# Patient Record
Sex: Male | Born: 1935 | Race: White | Hispanic: No | Smoking: Former smoker
Health system: Southern US, Community
[De-identification: ages and names within clinical notes are randomized; demographics above are authoritative.]

## PROBLEM LIST (undated history)

## (undated) DIAGNOSIS — E785 Hyperlipidemia, unspecified: Secondary | ICD-10-CM

## (undated) HISTORY — DX: Hyperlipidemia, unspecified: E78.5

---

## 2015-02-13 DIAGNOSIS — Z0000 Encounter for general adult medical examination without abnormal findings: Secondary | ICD-10-CM | POA: Diagnosis not present

## 2015-02-13 DIAGNOSIS — Z23 Encounter for immunization: Secondary | ICD-10-CM | POA: Diagnosis not present

## 2015-02-13 DIAGNOSIS — E780 Pure hypercholesterolemia: Secondary | ICD-10-CM | POA: Diagnosis not present

## 2015-06-11 DIAGNOSIS — Z23 Encounter for immunization: Secondary | ICD-10-CM | POA: Diagnosis not present

## 2015-06-13 DIAGNOSIS — H2513 Age-related nuclear cataract, bilateral: Secondary | ICD-10-CM | POA: Diagnosis not present

## 2015-06-13 DIAGNOSIS — Z961 Presence of intraocular lens: Secondary | ICD-10-CM | POA: Diagnosis not present

## 2015-09-20 DIAGNOSIS — H6122 Impacted cerumen, left ear: Secondary | ICD-10-CM | POA: Diagnosis not present

## 2016-05-29 DIAGNOSIS — Z23 Encounter for immunization: Secondary | ICD-10-CM | POA: Diagnosis not present

## 2016-06-24 ENCOUNTER — Encounter: Payer: Self-pay | Admitting: Internal Medicine

## 2016-06-24 ENCOUNTER — Ambulatory Visit (INDEPENDENT_AMBULATORY_CARE_PROVIDER_SITE_OTHER): Payer: Medicare Other | Admitting: Internal Medicine

## 2016-06-24 VITALS — BP 118/78 | HR 65 | Temp 97.6°F | Ht 65.0 in | Wt 139.6 lb

## 2016-06-24 DIAGNOSIS — E782 Mixed hyperlipidemia: Secondary | ICD-10-CM | POA: Insufficient documentation

## 2016-06-24 DIAGNOSIS — M7730 Calcaneal spur, unspecified foot: Secondary | ICD-10-CM

## 2016-06-24 NOTE — Patient Instructions (Addendum)
Continue current medications as ordered  Follow up in 1-2 mos for CPE/MMSE/ECG. Fasting labs prior to visit

## 2016-06-24 NOTE — Progress Notes (Signed)
Patient ID: Francesca Vos, male   DOB: 1929/02/05, 82 y.o.   MRN: [id number]    Location:  P[facility] Place of Service: OFFICE    Advanced Directive information Does patient have an advance directive?: No, Would patient like information on creating an advanced directive?: No - patient declined information  Chief Complaint  Patient presents with  . Establish Care    New patient to establish care    HPI:  82 yo male seen today as a new patient. He has no c/o. He has occasional heel spur pain. No falls. He relocated from [place] about 5 mos ago when his wife expired. He now lives a few doors down from his son.   Hyperlipidemia - takes lipitor. No myalgias.  He takes ASA and MVI daily  He had flu shot at [employer] 1 month ago   History reviewed. No pertinent past medical history.  History reviewed. No pertinent surgical history.  Patient Care Team: Gildardo Cranker, DO as PCP - General (Internal Medicine)  Social History   Social History  . Marital status: Widowed    Spouse name: N/A  . Number of children: N/A  . Years of education: N/A   Occupational History  . Not on file.   Social History Main Topics  . Smoking status: Former Smoker    Types: Cigarettes    Quit date: 08/31/1958  . Smokeless tobacco: Never Used  . Alcohol use Yes     Comment: 1 drink every other day  . Drug use: No  . Sexual activity: Not on file   Other Topics Concern  . Not on file   Social History Narrative   Diet: NA   Caffeine:1 to 2 cups per day   Married:Widowed, married in [place], one stories, one person   Pets:no   Current/Past profession:Business Owner   Exercise:Yes, walking   Living Will: Yes   DNR:Yes   POA/HPOA:Yes        reports that he quit smoking about 57 years ago. His smoking use included Cigarettes. He has never used smokeless tobacco. He reports that he drinks alcohol. He reports that he does not use drugs.  Family History  Problem Relation Age of  Onset  . Heart attack Brother   . Heart disease Brother    Family Status  Relation Status  . Mother Deceased  . Father Deceased  . Brother Alive  . Brother Deceased  . Daughter Alive  . Son Alive    Immunization History  Administered Date(s) Administered  . Influenza-Unspecified 06/03/2016    No Known Allergies  Medications: Patient's Medications  New Prescriptions   No medications on file  Previous Medications   ASPIRIN 81 MG EC TABLET    Take 81 mg by mouth daily. Swallow whole.   ATORVASTATIN (LIPITOR) 40 MG TABLET    Take 20 mg by mouth daily. High Cholesterol   MULTIPLE VITAMIN (ONE-A-DAY MENS PO)    Take by mouth daily.  Modified Medications   No medications on file  Discontinued Medications   No medications on file    Review of Systems  Musculoskeletal: Positive for gait problem.  All other systems reviewed and are negative.   Vitals:   06/24/16 0842  BP: 118/78  Pulse: 65  Temp: 97.6 F (36.4 C)  TempSrc: Oral  SpO2: 93%  Weight: 139 lb 9.6 oz (63.3 kg)  Height: 5\' 5"  (1.651 m)   Body mass index is 23.23 kg/m.  Physical Exam  Constitutional: He is oriented to person, place, and time. He appears well-developed and well-nourished.  HENT:  Mouth/Throat: Oropharynx is clear and moist.  Eyes: Pupils are equal, round, and reactive to light. No scleral icterus.  Neck: Neck supple. Carotid bruit is not present. No thyromegaly present.  Cardiovascular: Normal rate, regular rhythm, normal heart sounds and intact distal pulses.  Exam reveals no gallop and no friction rub.   No murmur heard. no distal LE swelling. No calf TTP  Pulmonary/Chest: Effort normal and breath sounds normal. He has no wheezes. He has no rales. He exhibits no tenderness.  Abdominal: Soft. Bowel sounds are normal. He exhibits no distension, no abdominal bruit, no pulsatile midline mass and no mass. There is no hepatomegaly. There is no tenderness. There is no rebound and no guarding.    Lymphadenopathy:    He has no cervical adenopathy.  Neurological: He is alert and oriented to person, place, and time. He has normal reflexes.  Skin: Skin is warm and dry. No rash noted.  Psychiatric: He has a normal mood and affect. His behavior is normal. Judgment and thought content normal.     Labs reviewed: No results found for any previous visit.    No results found.   Assessment/Plan   ICD-9-CM ICD-10-CM   1. Mixed hyperlipidemia 272.2 E78.2   2. Heel spur, unspecified laterality 726.73 M77.30     He plans to travel to [facility] x 4 mos to stay with his daughter for the winter. He is looking into living at [facility] once he returns.  Continue current meds as ordered  Get old records from previous PCP Dr Ronny Flurry  Follow up for CPE/MMSE/ECG. Fasting labs prior to visit (cmp for high risk med use; lipid panel/tsh for hyperlipidemia, cbc w diff for well visit)  Erik Burkett S. Perlie Gold  [facility] [address] [place], [place] [address] [phone] Cell (Monday-Friday 8 AM - 5 PM) [phone] After 5 PM and follow prompts

## 2016-07-24 DIAGNOSIS — T1490XA Injury, unspecified, initial encounter: Secondary | ICD-10-CM | POA: Diagnosis not present

## 2016-07-24 DIAGNOSIS — Z87891 Personal history of nicotine dependence: Secondary | ICD-10-CM | POA: Diagnosis not present

## 2016-07-24 DIAGNOSIS — S79919A Unspecified injury of unspecified hip, initial encounter: Secondary | ICD-10-CM | POA: Diagnosis not present

## 2016-07-24 DIAGNOSIS — W010XXA Fall on same level from slipping, tripping and stumbling without subsequent striking against object, initial encounter: Secondary | ICD-10-CM | POA: Diagnosis not present

## 2016-07-24 DIAGNOSIS — S72001D Fracture of unspecified part of neck of right femur, subsequent encounter for closed fracture with routine healing: Secondary | ICD-10-CM | POA: Diagnosis not present

## 2016-07-24 DIAGNOSIS — S72051A Unspecified fracture of head of right femur, initial encounter for closed fracture: Secondary | ICD-10-CM | POA: Diagnosis not present

## 2016-07-24 DIAGNOSIS — Z741 Need for assistance with personal care: Secondary | ICD-10-CM | POA: Diagnosis not present

## 2016-07-24 DIAGNOSIS — S72009A Fracture of unspecified part of neck of unspecified femur, initial encounter for closed fracture: Secondary | ICD-10-CM | POA: Diagnosis not present

## 2016-07-24 DIAGNOSIS — W19XXXA Unspecified fall, initial encounter: Secondary | ICD-10-CM | POA: Diagnosis not present

## 2016-07-24 DIAGNOSIS — R488 Other symbolic dysfunctions: Secondary | ICD-10-CM | POA: Diagnosis not present

## 2016-07-24 DIAGNOSIS — R279 Unspecified lack of coordination: Secondary | ICD-10-CM | POA: Diagnosis not present

## 2016-07-24 DIAGNOSIS — S72001A Fracture of unspecified part of neck of right femur, initial encounter for closed fracture: Secondary | ICD-10-CM | POA: Diagnosis not present

## 2016-07-24 DIAGNOSIS — Z4789 Encounter for other orthopedic aftercare: Secondary | ICD-10-CM | POA: Diagnosis not present

## 2016-07-24 DIAGNOSIS — E785 Hyperlipidemia, unspecified: Secondary | ICD-10-CM | POA: Diagnosis not present

## 2016-07-26 HISTORY — PX: HIP SURGERY: SHX245

## 2016-07-28 DIAGNOSIS — Z4789 Encounter for other orthopedic aftercare: Secondary | ICD-10-CM | POA: Diagnosis not present

## 2016-07-28 DIAGNOSIS — Z741 Need for assistance with personal care: Secondary | ICD-10-CM | POA: Diagnosis not present

## 2016-07-28 DIAGNOSIS — R488 Other symbolic dysfunctions: Secondary | ICD-10-CM | POA: Diagnosis not present

## 2016-07-28 DIAGNOSIS — Z043 Encounter for examination and observation following other accident: Secondary | ICD-10-CM | POA: Diagnosis not present

## 2016-07-28 DIAGNOSIS — S72001D Fracture of unspecified part of neck of right femur, subsequent encounter for closed fracture with routine healing: Secondary | ICD-10-CM | POA: Diagnosis not present

## 2016-07-28 DIAGNOSIS — S72009A Fracture of unspecified part of neck of unspecified femur, initial encounter for closed fracture: Secondary | ICD-10-CM | POA: Diagnosis not present

## 2016-07-28 DIAGNOSIS — S72001A Fracture of unspecified part of neck of right femur, initial encounter for closed fracture: Secondary | ICD-10-CM | POA: Diagnosis not present

## 2016-07-28 DIAGNOSIS — E785 Hyperlipidemia, unspecified: Secondary | ICD-10-CM | POA: Diagnosis not present

## 2016-07-28 DIAGNOSIS — R279 Unspecified lack of coordination: Secondary | ICD-10-CM | POA: Diagnosis not present

## 2016-07-29 DIAGNOSIS — E785 Hyperlipidemia, unspecified: Secondary | ICD-10-CM | POA: Diagnosis not present

## 2016-07-29 DIAGNOSIS — S72001D Fracture of unspecified part of neck of right femur, subsequent encounter for closed fracture with routine healing: Secondary | ICD-10-CM | POA: Diagnosis not present

## 2016-07-29 DIAGNOSIS — Z4789 Encounter for other orthopedic aftercare: Secondary | ICD-10-CM | POA: Diagnosis not present

## 2016-07-31 DIAGNOSIS — Z4789 Encounter for other orthopedic aftercare: Secondary | ICD-10-CM | POA: Diagnosis not present

## 2016-07-31 DIAGNOSIS — E785 Hyperlipidemia, unspecified: Secondary | ICD-10-CM | POA: Diagnosis not present

## 2016-07-31 DIAGNOSIS — S72001D Fracture of unspecified part of neck of right femur, subsequent encounter for closed fracture with routine healing: Secondary | ICD-10-CM | POA: Diagnosis not present

## 2016-08-03 DIAGNOSIS — S72001D Fracture of unspecified part of neck of right femur, subsequent encounter for closed fracture with routine healing: Secondary | ICD-10-CM | POA: Diagnosis not present

## 2016-08-03 DIAGNOSIS — E785 Hyperlipidemia, unspecified: Secondary | ICD-10-CM | POA: Diagnosis not present

## 2016-08-03 DIAGNOSIS — Z4789 Encounter for other orthopedic aftercare: Secondary | ICD-10-CM | POA: Diagnosis not present

## 2016-08-04 DIAGNOSIS — S72001D Fracture of unspecified part of neck of right femur, subsequent encounter for closed fracture with routine healing: Secondary | ICD-10-CM | POA: Diagnosis not present

## 2016-08-04 DIAGNOSIS — E785 Hyperlipidemia, unspecified: Secondary | ICD-10-CM | POA: Diagnosis not present

## 2016-08-04 DIAGNOSIS — Z4789 Encounter for other orthopedic aftercare: Secondary | ICD-10-CM | POA: Diagnosis not present

## 2016-08-05 LAB — CBC AND DIFFERENTIAL
HCT: 34 % — AB (ref 41–53)
Hemoglobin: 11.4 g/dL — AB (ref 13.5–17.5)
Platelets: 418 10*3/uL — AB (ref 150–399)
WBC: 10.3 10*3/mL

## 2016-08-07 DIAGNOSIS — Z4789 Encounter for other orthopedic aftercare: Secondary | ICD-10-CM | POA: Diagnosis not present

## 2016-08-07 DIAGNOSIS — E785 Hyperlipidemia, unspecified: Secondary | ICD-10-CM | POA: Diagnosis not present

## 2016-08-07 DIAGNOSIS — S72001D Fracture of unspecified part of neck of right femur, subsequent encounter for closed fracture with routine healing: Secondary | ICD-10-CM | POA: Diagnosis not present

## 2016-08-10 ENCOUNTER — Telehealth: Payer: Self-pay

## 2016-08-10 DIAGNOSIS — M79604 Pain in right leg: Secondary | ICD-10-CM | POA: Diagnosis not present

## 2016-08-10 DIAGNOSIS — M79605 Pain in left leg: Secondary | ICD-10-CM | POA: Diagnosis not present

## 2016-08-10 DIAGNOSIS — M7989 Other specified soft tissue disorders: Secondary | ICD-10-CM | POA: Diagnosis not present

## 2016-08-10 NOTE — Telephone Encounter (Signed)
noted 

## 2016-08-10 NOTE — Telephone Encounter (Signed)
Patient called to inform Dr.Carter that he broke his right hip the day after thanksgiving and had his first surgery on 07/26/16. Patient in rehab in [place], patient is expecting to return in March 2018.

## 2016-08-11 DIAGNOSIS — S72001D Fracture of unspecified part of neck of right femur, subsequent encounter for closed fracture with routine healing: Secondary | ICD-10-CM | POA: Diagnosis not present

## 2016-08-11 DIAGNOSIS — Z4801 Encounter for change or removal of surgical wound dressing: Secondary | ICD-10-CM | POA: Diagnosis not present

## 2016-08-11 DIAGNOSIS — M25551 Pain in right hip: Secondary | ICD-10-CM | POA: Diagnosis not present

## 2016-08-11 DIAGNOSIS — W109XXD Fall (on) (from) unspecified stairs and steps, subsequent encounter: Secondary | ICD-10-CM | POA: Diagnosis not present

## 2016-08-11 DIAGNOSIS — R2689 Other abnormalities of gait and mobility: Secondary | ICD-10-CM | POA: Diagnosis not present

## 2016-08-12 DIAGNOSIS — R2689 Other abnormalities of gait and mobility: Secondary | ICD-10-CM | POA: Diagnosis not present

## 2016-08-12 DIAGNOSIS — Z4801 Encounter for change or removal of surgical wound dressing: Secondary | ICD-10-CM | POA: Diagnosis not present

## 2016-08-12 DIAGNOSIS — W109XXD Fall (on) (from) unspecified stairs and steps, subsequent encounter: Secondary | ICD-10-CM | POA: Diagnosis not present

## 2016-08-12 DIAGNOSIS — M25551 Pain in right hip: Secondary | ICD-10-CM | POA: Diagnosis not present

## 2016-08-12 DIAGNOSIS — S72001D Fracture of unspecified part of neck of right femur, subsequent encounter for closed fracture with routine healing: Secondary | ICD-10-CM | POA: Diagnosis not present

## 2016-08-14 DIAGNOSIS — R2689 Other abnormalities of gait and mobility: Secondary | ICD-10-CM | POA: Diagnosis not present

## 2016-08-14 DIAGNOSIS — S72001D Fracture of unspecified part of neck of right femur, subsequent encounter for closed fracture with routine healing: Secondary | ICD-10-CM | POA: Diagnosis not present

## 2016-08-14 DIAGNOSIS — M25551 Pain in right hip: Secondary | ICD-10-CM | POA: Diagnosis not present

## 2016-08-14 DIAGNOSIS — Z4801 Encounter for change or removal of surgical wound dressing: Secondary | ICD-10-CM | POA: Diagnosis not present

## 2016-08-14 DIAGNOSIS — W109XXD Fall (on) (from) unspecified stairs and steps, subsequent encounter: Secondary | ICD-10-CM | POA: Diagnosis not present

## 2016-08-18 DIAGNOSIS — M25551 Pain in right hip: Secondary | ICD-10-CM | POA: Diagnosis not present

## 2016-08-18 DIAGNOSIS — S72001D Fracture of unspecified part of neck of right femur, subsequent encounter for closed fracture with routine healing: Secondary | ICD-10-CM | POA: Diagnosis not present

## 2016-08-18 DIAGNOSIS — R2689 Other abnormalities of gait and mobility: Secondary | ICD-10-CM | POA: Diagnosis not present

## 2016-08-18 DIAGNOSIS — Z4801 Encounter for change or removal of surgical wound dressing: Secondary | ICD-10-CM | POA: Diagnosis not present

## 2016-08-18 DIAGNOSIS — W109XXD Fall (on) (from) unspecified stairs and steps, subsequent encounter: Secondary | ICD-10-CM | POA: Diagnosis not present

## 2016-08-21 DIAGNOSIS — R2689 Other abnormalities of gait and mobility: Secondary | ICD-10-CM | POA: Diagnosis not present

## 2016-08-21 DIAGNOSIS — M25551 Pain in right hip: Secondary | ICD-10-CM | POA: Diagnosis not present

## 2016-08-21 DIAGNOSIS — W109XXD Fall (on) (from) unspecified stairs and steps, subsequent encounter: Secondary | ICD-10-CM | POA: Diagnosis not present

## 2016-08-21 DIAGNOSIS — Z4801 Encounter for change or removal of surgical wound dressing: Secondary | ICD-10-CM | POA: Diagnosis not present

## 2016-08-21 DIAGNOSIS — S72001D Fracture of unspecified part of neck of right femur, subsequent encounter for closed fracture with routine healing: Secondary | ICD-10-CM | POA: Diagnosis not present

## 2016-08-27 DIAGNOSIS — S72001D Fracture of unspecified part of neck of right femur, subsequent encounter for closed fracture with routine healing: Secondary | ICD-10-CM | POA: Diagnosis not present

## 2016-08-27 DIAGNOSIS — N400 Enlarged prostate without lower urinary tract symptoms: Secondary | ICD-10-CM | POA: Diagnosis not present

## 2016-08-28 DIAGNOSIS — S72001D Fracture of unspecified part of neck of right femur, subsequent encounter for closed fracture with routine healing: Secondary | ICD-10-CM | POA: Diagnosis not present

## 2016-09-01 DIAGNOSIS — M1651 Unilateral post-traumatic osteoarthritis, right hip: Secondary | ICD-10-CM | POA: Diagnosis not present

## 2016-09-03 DIAGNOSIS — S72001A Fracture of unspecified part of neck of right femur, initial encounter for closed fracture: Secondary | ICD-10-CM | POA: Diagnosis not present

## 2016-09-03 DIAGNOSIS — Z96641 Presence of right artificial hip joint: Secondary | ICD-10-CM | POA: Diagnosis not present

## 2016-09-07 DIAGNOSIS — M1651 Unilateral post-traumatic osteoarthritis, right hip: Secondary | ICD-10-CM | POA: Diagnosis not present

## 2016-09-11 ENCOUNTER — Telehealth: Payer: Self-pay

## 2016-09-11 NOTE — Telephone Encounter (Signed)
Patient will return October 03, 2016 from [place]. Patient has 4 more physical therapy appointments to complete and feels he is doing great.   Patient would like home health evaluation when he returns. Patient needs his home evaluated for safety precautions following his right hip surgery and possible more physical therapy.   Patient was informed he may need to schedule a follow-up with Dr.Carter first and then a referral can be placed for we do not have any documentation of right hip surgery.   Please advise

## 2016-09-11 NOTE — Telephone Encounter (Signed)
Spoke with Ulice Dash, patients son he stated that he would need to speak to his father and physical therapist before he schedule an appointment. He said that he would give our office a call to schedule an appointment.

## 2016-09-11 NOTE — Telephone Encounter (Signed)
He needs appt to assess his needs 1st

## 2016-09-11 NOTE — Telephone Encounter (Signed)
Called patient at both numbers listed the [phone] home number I am unable to leave a message and the [phone] cell number is no longer working.

## 2016-09-18 ENCOUNTER — Telehealth: Payer: Self-pay | Admitting: *Deleted

## 2016-09-18 DIAGNOSIS — M1651 Unilateral post-traumatic osteoarthritis, right hip: Secondary | ICD-10-CM | POA: Diagnosis not present

## 2016-09-18 DIAGNOSIS — S72001A Fracture of unspecified part of neck of right femur, initial encounter for closed fracture: Secondary | ICD-10-CM

## 2016-09-18 NOTE — Telephone Encounter (Signed)
Order placed and son notified.

## 2016-09-18 NOTE — Telephone Encounter (Signed)
Patient son notified. His father has not came back yet.

## 2016-09-18 NOTE — Telephone Encounter (Signed)
Appointment scheduled.

## 2016-09-18 NOTE — Telephone Encounter (Signed)
Patient son, Ulice Dash called and stated that patient was in [place] visiting his daughter and fell and busted hip. Had hip surgery and receiving PT down there. Surgeon cleared him to come home. Son is wanting to know if you know who would place grab bars in his home. He has an appointment to follow up with you on 10/07/16. Please Advise.

## 2016-09-18 NOTE — Addendum Note (Signed)
Addended by: Rafael Bihari A on: 09/18/2016 04:41 PM   Modules accepted: Orders

## 2016-09-18 NOTE — Telephone Encounter (Signed)
Is it ok to refer patient to [facility] for here for them to go out and evaluate and treat. Please Advise.

## 2016-09-18 NOTE — Telephone Encounter (Signed)
Yes. I thought we already referred him to [facility]? He will need to continue PT/OT once he returns to the area

## 2016-09-18 NOTE — Telephone Encounter (Signed)
Home health should be able to place grab bars. Please contact them

## 2016-09-21 DIAGNOSIS — M1651 Unilateral post-traumatic osteoarthritis, right hip: Secondary | ICD-10-CM | POA: Diagnosis not present

## 2016-09-24 DIAGNOSIS — M7989 Other specified soft tissue disorders: Secondary | ICD-10-CM | POA: Diagnosis not present

## 2016-09-24 DIAGNOSIS — M1651 Unilateral post-traumatic osteoarthritis, right hip: Secondary | ICD-10-CM | POA: Diagnosis not present

## 2016-09-24 DIAGNOSIS — Z96641 Presence of right artificial hip joint: Secondary | ICD-10-CM | POA: Diagnosis not present

## 2016-09-24 DIAGNOSIS — S72001A Fracture of unspecified part of neck of right femur, initial encounter for closed fracture: Secondary | ICD-10-CM | POA: Diagnosis not present

## 2016-09-29 DIAGNOSIS — M1651 Unilateral post-traumatic osteoarthritis, right hip: Secondary | ICD-10-CM | POA: Diagnosis not present

## 2016-10-06 ENCOUNTER — Other Ambulatory Visit: Payer: Self-pay

## 2016-10-06 DIAGNOSIS — R261 Paralytic gait: Secondary | ICD-10-CM | POA: Diagnosis not present

## 2016-10-06 DIAGNOSIS — Z7982 Long term (current) use of aspirin: Secondary | ICD-10-CM | POA: Diagnosis not present

## 2016-10-06 DIAGNOSIS — Z9181 History of falling: Secondary | ICD-10-CM | POA: Diagnosis not present

## 2016-10-06 DIAGNOSIS — E782 Mixed hyperlipidemia: Secondary | ICD-10-CM

## 2016-10-06 DIAGNOSIS — Z0000 Encounter for general adult medical examination without abnormal findings: Secondary | ICD-10-CM

## 2016-10-06 DIAGNOSIS — M7730 Calcaneal spur, unspecified foot: Secondary | ICD-10-CM | POA: Diagnosis not present

## 2016-10-06 DIAGNOSIS — Z79899 Other long term (current) drug therapy: Secondary | ICD-10-CM

## 2016-10-06 DIAGNOSIS — S72001D Fracture of unspecified part of neck of right femur, subsequent encounter for closed fracture with routine healing: Secondary | ICD-10-CM | POA: Diagnosis not present

## 2016-10-07 ENCOUNTER — Telehealth: Payer: Self-pay

## 2016-10-07 ENCOUNTER — Telehealth: Payer: Self-pay | Admitting: Internal Medicine

## 2016-10-07 ENCOUNTER — Ambulatory Visit (INDEPENDENT_AMBULATORY_CARE_PROVIDER_SITE_OTHER): Payer: Medicare Other | Admitting: Internal Medicine

## 2016-10-07 ENCOUNTER — Encounter: Payer: Self-pay | Admitting: Internal Medicine

## 2016-10-07 VITALS — BP 124/68 | HR 76 | Temp 97.7°F | Ht 65.0 in | Wt 136.6 lb

## 2016-10-07 DIAGNOSIS — S72001A Fracture of unspecified part of neck of right femur, initial encounter for closed fracture: Secondary | ICD-10-CM

## 2016-10-07 DIAGNOSIS — Z967 Presence of other bone and tendon implants: Secondary | ICD-10-CM | POA: Diagnosis not present

## 2016-10-07 DIAGNOSIS — Z8781 Personal history of (healed) traumatic fracture: Secondary | ICD-10-CM | POA: Diagnosis not present

## 2016-10-07 DIAGNOSIS — E782 Mixed hyperlipidemia: Secondary | ICD-10-CM | POA: Diagnosis not present

## 2016-10-07 DIAGNOSIS — Z7982 Long term (current) use of aspirin: Secondary | ICD-10-CM | POA: Diagnosis not present

## 2016-10-07 DIAGNOSIS — Z9181 History of falling: Secondary | ICD-10-CM | POA: Diagnosis not present

## 2016-10-07 DIAGNOSIS — M7730 Calcaneal spur, unspecified foot: Secondary | ICD-10-CM | POA: Diagnosis not present

## 2016-10-07 DIAGNOSIS — Z9889 Other specified postprocedural states: Secondary | ICD-10-CM

## 2016-10-07 DIAGNOSIS — S72001D Fracture of unspecified part of neck of right femur, subsequent encounter for closed fracture with routine healing: Secondary | ICD-10-CM | POA: Diagnosis not present

## 2016-10-07 DIAGNOSIS — R261 Paralytic gait: Secondary | ICD-10-CM | POA: Diagnosis not present

## 2016-10-07 NOTE — Patient Instructions (Addendum)
Continue current medications as ordered  Will call with referral for Ortho  Follow up as scheduled

## 2016-10-07 NOTE — Telephone Encounter (Signed)
left message asking pt to confirm this AWV appt w/ nurse. VDM (DD) °

## 2016-10-07 NOTE — Progress Notes (Signed)
Patient ID: Dalton Golden, male   DOB: 1928-10-31, 83 y.o.   MRN: [id number]    Location:  P[facility] Place of Service: OFFICE  Chief Complaint  Patient presents with  . Follow-up    Follow up from surgery    HPI:  83 yo male seen today for hospital f/u. He fell and broke his right hip Jul 24, 2016 while in [place]. He underwent right hip ORIF 07/26/16 and spent several weeks in rehab in [place], [place]. Records not available for review at this time  Hyperlipidemia - takes lipitor. No myalgias.  He takes ASA and MVI daily   Past Medical History:  Diagnosis Date  . Hyperlipidemia     Past Surgical History:  Procedure Laterality Date  . HIP SURGERY Right 07/26/2016   [facility]    Patient Care Team: Gildardo Cranker, DO as PCP - General (Internal Medicine)  Social History   Social History  . Marital status: Widowed    Spouse name: N/A  . Number of children: N/A  . Years of education: N/A   Occupational History  . Not on file.   Social History Main Topics  . Smoking status: Former Smoker    Types: Cigarettes    Quit date: 08/31/1958  . Smokeless tobacco: Never Used  . Alcohol use Yes     Comment: 1 drink every other day  . Drug use: No  . Sexual activity: Not on file   Other Topics Concern  . Not on file   Social History Narrative   Diet: NA   Caffeine:1 to 2 cups per day   Married:Widowed, married in [place], one stories, one person   Pets:no   Current/Past profession:Business Owner   Exercise:Yes, walking   Living Will: Yes   DNR:Yes   POA/HPOA:Yes        reports that he quit smoking about 58 years ago. His smoking use included Cigarettes. He has never used smokeless tobacco. He reports that he drinks alcohol. He reports that he does not use drugs.  Family History  Problem Relation Age of Onset  . Heart attack Brother   . Heart disease Brother    Family Status  Relation Status  . Mother Deceased  . Father Deceased  . Brother  Alive  . Brother Deceased  . Daughter Alive  . Son Alive     No Known Allergies  Medications: Patient's Medications  New Prescriptions   No medications on file  Previous Medications   ASPIRIN 81 MG EC TABLET    Take 81 mg by mouth daily. Swallow whole.   ATORVASTATIN (LIPITOR) 40 MG TABLET    Take 20 mg by mouth daily. High Cholesterol   DOCUSATE CALCIUM (STOOL SOFTENER PO)    Take 1 tablet by mouth daily.   MULTIPLE VITAMIN (ONE-A-DAY MENS PO)    Take by mouth daily.  Modified Medications   No medications on file  Discontinued Medications   No medications on file    Review of Systems  Musculoskeletal: Positive for gait problem.  All other systems reviewed and are negative.   Vitals:   10/07/16 0905  BP: 124/68  Pulse: 76  Temp: 97.7 F (36.5 C)  TempSrc: Oral  SpO2: 95%  Weight: 136 lb 9.6 oz (62 kg)  Height: 5\' 5"  (1.651 m)   Body mass index is 22.73 kg/m.  Physical Exam  Constitutional: He is oriented to person, place, and time. He appears well-developed and well-nourished.  HENT:  Mouth/Throat: Oropharynx is clear and moist.  Eyes: Pupils are equal, round, and reactive to light. No scleral icterus.  Neck: Neck supple. Carotid bruit is not present. No thyromegaly present.  Cardiovascular: Normal rate, regular rhythm, normal heart sounds and intact distal pulses.  Exam reveals no gallop and no friction rub.   No murmur heard. no distal LE swelling. No calf TTP  Pulmonary/Chest: Effort normal and breath sounds normal. He has no wheezes. He has no rales. He exhibits no tenderness.  Abdominal: Soft. Bowel sounds are normal. He exhibits no distension, no abdominal bruit, no pulsatile midline mass and no mass. There is no hepatomegaly. There is no tenderness. There is no rebound and no guarding.  Musculoskeletal: He exhibits edema.  Right proximal hip incisional scar, NT and no wound dehiscence.  Lymphadenopathy:    He has no cervical adenopathy.  Neurological:  He is alert and oriented to person, place, and time. Gait abnormal.  Skin: Skin is warm and dry. No rash noted.  Psychiatric: He has a normal mood and affect. His behavior is normal. Judgment and thought content normal.     Labs reviewed: No visits with results within 3 Month(s) from this visit.  Latest known visit with results is:  No results found for any previous visit.    No results found.   Assessment/Plan   ICD-9-CM ICD-10-CM   1. S/P ORIF (open reduction internal fixation) fracture V45.89 Z96.7 Ambulatory referral to Orthopedic Surgery   V15.51 Z87.81    right hip  2. Closed fracture of right hip, initial encounter ([place]) 820.8 S72.001A Ambulatory referral to Orthopedic Surgery  3. Mixed hyperlipidemia 272.2 E78.2     Continue HH PT/OT for gait training and balance. He also may benefit from grab bars in home  Continue current medications as ordered  Will call with referral for Ortho  Follow up as scheduled   Joelie Schou S. Perlie Gold  [facility] [address] [place], [place] [address] [phone] Cell (Monday-Friday 8 AM - 5 PM) [phone] After 5 PM and follow prompts

## 2016-10-07 NOTE — Telephone Encounter (Signed)
Left message for medical records to give [place] a call back to request his medical records.

## 2016-10-07 NOTE — Telephone Encounter (Signed)
Order placed

## 2016-10-07 NOTE — Telephone Encounter (Signed)
Patient was evaluated by Laurey Arrow (physical therapist) with [facility]. Patient is doing great and Laurey Arrow is recommending out-patient physical therapy (patient and son in agreement).   Please advise

## 2016-10-07 NOTE — Telephone Encounter (Signed)
[facility] for o/p PT. Please send referral. He is s/p right hip ORIF

## 2016-10-13 ENCOUNTER — Encounter (INDEPENDENT_AMBULATORY_CARE_PROVIDER_SITE_OTHER): Payer: Self-pay | Admitting: Orthopaedic Surgery

## 2016-10-13 ENCOUNTER — Ambulatory Visit (INDEPENDENT_AMBULATORY_CARE_PROVIDER_SITE_OTHER): Payer: Medicare Other | Admitting: Orthopaedic Surgery

## 2016-10-13 ENCOUNTER — Ambulatory Visit (INDEPENDENT_AMBULATORY_CARE_PROVIDER_SITE_OTHER): Payer: Medicare Other

## 2016-10-13 DIAGNOSIS — S72001A Fracture of unspecified part of neck of right femur, initial encounter for closed fracture: Secondary | ICD-10-CM

## 2016-10-13 NOTE — Progress Notes (Signed)
Office Visit Note   Patient: Denahi Ladzinski           Date of Birth: 1929-08-09           MRN: [id number] Visit Date: 10/13/2016              Requested by: Gildardo Cranker, DO [address] [place], [place] [id number] PCP: Gildardo Cranker, DO   Assessment & Plan: Visit Diagnoses:  1. Closed displaced fracture of right femoral neck ([place])     Plan: patient is doing very well.  Continue with HEP.  Cane as needed.  F/u prn.  Follow-Up Instructions: Return if symptoms worsen or fail to improve.   Orders:  Orders Placed This Encounter  Procedures  . XR HIP UNILAT W OR W/O PELVIS 2-3 VIEWS RIGHT   No orders of the defined types were placed in this encounter.     Procedures: No procedures performed   Clinical Data: No additional findings.   Subjective: Chief Complaint  Patient presents with  . Right Hip - Pain    83 yo male here for right hip fracture treated with hemi back in November in [place].  Doing well.  Has moved back up to [place].  PCP would like him to come see [place] for f/u.  He denies any pain or issues.  Walking with cane at good pace.      Review of Systems   Objective: Vital Signs: There were no vitals taken for this visit.  Physical Exam  Ortho Exam Right hip exam - well healed scar - ambulates at normal pace, no limp - good ROM  Specialty Comments:  No specialty comments available.  Imaging: Xr Hip Unilat W Or W/o Pelvis 2-3 Views Right  Result Date: 10/13/2016 Stable hemiarthroplasty    PMFS History: Patient Active Problem List   Diagnosis Date Noted  . Closed displaced fracture of right femoral neck ([place]) 10/13/2016  . Mixed hyperlipidemia 06/24/2016  . Heel spur, unspecified laterality 06/24/2016   Past Medical History:  Diagnosis Date  . Hyperlipidemia     Family History  Problem Relation Age of Onset  . Heart attack Brother   . Heart disease Brother     Past Surgical History:  Procedure Laterality Date  . HIP  SURGERY Right 07/26/2016   [facility]   Social History   Occupational History  . Not on file.   Social History Main Topics  . Smoking status: Former Smoker    Types: Cigarettes    Quit date: 08/31/1958  . Smokeless tobacco: Never Used  . Alcohol use Yes     Comment: 1 drink every other day  . Drug use: No  . Sexual activity: Not on file          Office Visit Note   Patient: Tristion Vint           Date of Birth: 11-15-1928           MRN: [id number] Visit Date:               Requested by: Gildardo Cranker, DO [place], [place] [address] PCP: Gildardo Cranker, DO   Assessment & Plan: Visit Diagnoses:  1. Closed displaced fracture of right femoral neck ([place])     Plan: see other plan  Follow-Up Instructions: Return if symptoms worsen or fail to improve.   Orders:  Orders Placed This Encounter  Procedures  . XR HIP UNILAT W OR W/O PELVIS 2-3  VIEWS RIGHT   No orders of the defined types were placed in this encounter.     Procedures: No procedures performed   Clinical Data: No additional findings.   Subjective: Chief Complaint  Patient presents with  . Right Hip - Pain    HPI  Review of Systems   Objective: Vital Signs: There were no vitals taken for this visit.  Physical Exam  Ortho Exam  Specialty Comments:  No specialty comments available.  Imaging: Xr Hip Unilat W Or W/o Pelvis 2-3 Views Right  Result Date: 10/13/2016 Stable hemiarthroplasty    PMFS History: Patient Active Problem List   Diagnosis Date Noted  . Closed displaced fracture of right femoral neck ([place]) 10/13/2016  . Mixed hyperlipidemia 06/24/2016  . Heel spur, unspecified laterality 06/24/2016   Past Medical History:  Diagnosis Date  . Hyperlipidemia     Family History  Problem Relation Age of Onset  . Heart attack Brother   . Heart disease Brother     Past Surgical History:  Procedure Laterality Date  . HIP SURGERY Right  07/26/2016   [facility]   Social History   Occupational History  . Not on file.   Social History Main Topics  . Smoking status: Former Smoker    Types: Cigarettes    Quit date: 08/31/1958  . Smokeless tobacco: Never Used  . Alcohol use Yes     Comment: 1 drink every other day  . Drug use: No  . Sexual activity: Not on file

## 2016-10-20 ENCOUNTER — Encounter: Payer: Self-pay | Admitting: Internal Medicine

## 2016-10-22 ENCOUNTER — Ambulatory Visit: Payer: Medicare Other | Attending: Internal Medicine | Admitting: Physical Therapy

## 2016-10-22 DIAGNOSIS — M25651 Stiffness of right hip, not elsewhere classified: Secondary | ICD-10-CM | POA: Diagnosis not present

## 2016-10-22 DIAGNOSIS — R262 Difficulty in walking, not elsewhere classified: Secondary | ICD-10-CM | POA: Insufficient documentation

## 2016-10-22 DIAGNOSIS — M6281 Muscle weakness (generalized): Secondary | ICD-10-CM

## 2016-10-23 ENCOUNTER — Encounter: Payer: Self-pay | Admitting: Physical Therapy

## 2016-10-23 NOTE — Therapy (Signed)
[place], [place], [address] Phone: [phone]   Fax:  [phone]  Physical Therapy Evaluation  Patient Details  Name: Authur Laughead MRN: [id number] Date of Birth: [birth year] Referring Provider: Dr Frankey Shown   Encounter Date: 10/22/2016      PT End of Session - 10/22/16 1537    Visit Number 1   Number of Visits 16   Date for PT Re-Evaluation 12/17/16   Authorization Type Medicare   Activity Tolerance Patient tolerated treatment well   Behavior During Therapy [facility] for tasks assessed/performed      Past Medical History:  Diagnosis Date  . Hyperlipidemia     Past Surgical History:  Procedure Laterality Date  . HIP SURGERY Right 07/26/2016   [facility]    There were no vitals filed for this visit.       Subjective Assessment - 10/22/16 1533    Subjective Patient had a fall in [place] and had a hemiarthroplasty on the right. He has made good progress since that point. He currently feels like he is having difficulty stepping into the bathtub and putting his shoes on.    Limitations Standing;House hold activities   How long can you sit comfortably? No limit   How long can you stand comfortably? No limit    How long can you walk comfortably? decreased community ambualtion from baseline    Diagnostic tests Nothing recently    Patient Stated Goals To put his shoes on    Currently in Pain? No/denies            [place] PT Assessment - 10/23/16 0001      Assessment   Medical Diagnosis Right hip    Referring Provider Dr Frankey Shown    Onset Date/Surgical Date 07/25/16   Hand Dominance Right   Next MD Visit None Scheduled      Precautions   Precautions None     Restrictions   Weight Bearing Restrictions No   Other Position/Activity Restrictions n     Home Environment   Additional Comments 1 step into the living area and 1 step into the bathroom      Prior Function    Level of Independence Independent   Vocation Retired   Leisure walking,      Associate Professor   Overall Cognitive Status Within Functional Limits for tasks assessed   Attention Focused   Focused Attention Appears intact   Memory Appears intact   Awareness Appears intact   Problem Solving Appears intact     Sensation   Additional Comments No numbness      Coordination   Gross Motor Movements are Fluid and Coordinated Yes   Fine Motor Movements are Fluid and Coordinated Yes     AROM   AROM Assessment Site Hip;Knee   Right/Left Hip Right   Right Hip Flexion 90  No pain but resitance felt. No hard endfeel.    Right Hip External Rotation  23   Right Hip Internal Rotation  10   Right/Left Knee Right     Strength   Strength Assessment Site Hip   Right/Left Hip Right;Left   Right Hip Flexion 4+/5   Right Hip ABduction 4+/5   Left Hip Flexion 5/5   Left Hip ABduction 5/5     Palpation   Palpation comment No tenderness to palpation                   [facility] Adult PT  Treatment/Exercise - 10/23/16 0001      Lumbar Exercises: Stretches   Single Knee to Chest Stretch Limitations 3x20sec hold    Lower Trunk Rotation Limitations x10     Knee/Hip Exercises: Supine   Straight Leg Raises Limitations 2x10     Manual Therapy   Manual therapy comments Gnetle manual flexion stretching being carefull not to push him too far back into flexion. Patient reports his scar is anteirolateral but therapy will still be cautious 2nd to his age. Gentler ER stretching to improve patients ability to put his shoes on.                 PT Education - 10/23/16 1230    Education provided Yes   Education Details advised with hip flexion stretching not to go too far past 90 degrees    Person(s) Educated Patient   Methods Explanation;Demonstration;Tactile cues   Comprehension Verbalized understanding;Returned demonstration          PT Short Term Goals - 10/23/16 1237      PT SHORT  TERM GOAL #1   Title Patient will increase passive hip flexion on the right to 100 degrees    Time 4   Period Weeks   Status New     PT SHORT TERM GOAL #2   Title Patient will increase hip external rotation by 10 degrees    Time 4   Period Weeks   Status New     PT SHORT TERM GOAL #3   Title Patient will demsotrate 5/5 gross right hip strength    Time 4   Status New     PT SHORT TERM GOAL #4   Title Patient will be independent with initial HEP    Time 4   Period Weeks   Status New           PT Long Term Goals - 10/23/16 1238      PT LONG TERM GOAL #1   Title Patient will put his right shoe on without difficulty    Time 8   Period Weeks   Status New     PT LONG TERM GOAL #2   Title Patient will ambualte 1 mile without increased fatigue    Time 8   Period Weeks   Status New     PT LONG TERM GOAL #3   Title Patient will go up a step in his house without difficulty    Time 8   Period Weeks   Status New               Plan - 10/22/16 1538    Clinical Impression Statement Patient is a 83 year old male S/P rhip Hemiarthroplasty on 07/26/2017. He presents with decreased hip internal and external rotation as well as decreased hip flexion. His ambualtion distance is decreased compared to baseline. He is very active. He also feels like he has some difficulty with his step up into his house from his mudroom. He was given light stretching but advised to not push his stretches. With light stretching and strengthening he should be able to reach his goals of stepping into the tub and putting his shoes on. He was seen for a low comlpexity evalautions.    Rehab Potential Good   PT Frequency 2x / week   PT Duration 8 weeks   PT Treatment/Interventions ADLs/Self Care Home Management;Cryotherapy;Electrical Stimulation;Iontophoresis 4mg /ml Dexamethasone;Stair training;Gait training;Ultrasound;Moist Heat;Therapeutic activities;Therapeutic exercise;Patient/family  education;Passive range of motion;Manual techniques;Taping;Splinting   PT Next Visit  Plan continue with light manual hip stretching and reviewe HEP stretches, Consider quad set, SAQ, supine hip flexion, any type of hip abduction strengthening, 4 inch step up, standing hip flexion.    PT Home Exercise Plan lateral trunk rotation to improve hip ER/.IR, single knee to chest to improve hip flexion, Straight leg raise.    Consulted and Agree with Plan of Care Patient      Patient will benefit from skilled therapeutic intervention in order to improve the following deficits and impairments:  Abnormal gait, Decreased activity tolerance, Decreased strength, Decreased endurance, Decreased mobility, Difficulty walking, Decreased range of motion  Visit Diagnosis: Stiffness of right hip, not elsewhere classified - Plan: PT plan of care cert/re-cert  Difficulty in walking, not elsewhere classified - Plan: PT plan of care cert/re-cert  Muscle weakness (generalized) - Plan: PT plan of care cert/re-cert      G-Codes - [id number]    Functional Assessment Tool Used (Outpatient Only) FOTO, clinical decision making    Functional Limitation Mobility: Walking and moving around   Mobility: Walking and Moving Around Current Status [id number]) At least 40 percent but less than 60 percent impaired, limited or restricted   Mobility: Walking and Moving Around Goal Status [phone]) At least 20 percent but less than 40 percent impaired, limited or restricted       Problem List Patient Active Problem List   Diagnosis Date Noted  . Closed displaced fracture of right femoral neck ([place]) 10/13/2016  . Mixed hyperlipidemia 06/24/2016  . Heel spur, unspecified laterality 06/24/2016    Carney Living PT DPT  10/23/2016, 1:06 PM  [facility] [address] [place], [place], [address] Phone: [phone]   Fax:  [phone]  Name: Kaulin Parrotte MRN:  [id number] Date of Birth: 06-Jul-1929

## 2016-10-27 ENCOUNTER — Ambulatory Visit: Payer: Medicare Other | Admitting: Physical Therapy

## 2016-11-03 ENCOUNTER — Telehealth (INDEPENDENT_AMBULATORY_CARE_PROVIDER_SITE_OTHER): Payer: Self-pay | Admitting: Orthopaedic Surgery

## 2016-11-03 ENCOUNTER — Ambulatory Visit: Payer: Medicare Other | Attending: Internal Medicine | Admitting: Physical Therapy

## 2016-11-03 DIAGNOSIS — M25651 Stiffness of right hip, not elsewhere classified: Secondary | ICD-10-CM

## 2016-11-03 DIAGNOSIS — R262 Difficulty in walking, not elsewhere classified: Secondary | ICD-10-CM

## 2016-11-03 DIAGNOSIS — M6281 Muscle weakness (generalized): Secondary | ICD-10-CM

## 2016-11-03 NOTE — Therapy (Signed)
[place], [place], [address] Phone: [phone]   Fax:  [phone]  Physical Therapy Treatment  Patient Details  Name: Dalton Golden MRN: [id number] Date of Birth: [birth year] Referring Provider: Dr Frankey Shown   Encounter Date: 11/03/2016      PT End of Session - 11/03/16 1702    Visit Number 2   Number of Visits 16   Date for PT Re-Evaluation 12/17/16   PT Start Time [id number]   PT Stop Time 1502   PT Time Calculation (min) 28 min   Activity Tolerance Patient tolerated treatment well   Behavior During Therapy [facility] for tasks assessed/performed      Past Medical History:  Diagnosis Date  . Hyperlipidemia     Past Surgical History:  Procedure Laterality Date  . HIP SURGERY Right 07/26/2016   [facility]    There were no vitals filed for this visit.      Subjective Assessment - 11/03/16 1446    Subjective Patient was late,  he could not find a place to eat.  I woiuld like to be able to tie my shoes without forcing my hip.   Currently in Pain? No/denies                         [name] - 11/03/16 0001      Ambulation/Gait   Pre-Gait Activities weight shifts at wall,  increased gluteal activation noted with these     Knee/Hip Exercises: Standing   Knee Flexion 1 set;10 reps   Hip Flexion 1 set;10 reps   Hip Flexion Limitations cues to keep on task   Forward Step Up Right;1 set;10 reps;Hand Hold: 2;Step Height: 4"   Forward Step Up Limitations patient steps up with ease using arms to pull vs a lot of hip musculature,  cues did not help his technique     Knee/Hip Exercises: Seated   Other Seated Knee/Hip Exercises Hip ER 10 x with 1 second hold,  cued for technique and posture.     Knee/Hip Exercises: Supine   Bridges Limitations 10 X   Other Supine Knee/Hip Exercises clams with band 10 X     Manual Therapy   Manual Therapy Passive  ROM   Passive ROM Right hip abduction, flexion ER ,  heavy cues to get patient to relax and decrease guarding,  Very gentle stretches                  PT Short Term Goals - 11/03/16 1706      PT SHORT TERM GOAL #1   Title Patient will increase passive hip flexion on the right to 100 degrees    Time 4   Period Weeks   Status On-going     PT SHORT TERM GOAL #2   Title Patient will increase hip external rotation by 10 degrees    Time 4   Period Weeks   Status On-going     PT SHORT TERM GOAL #3   Title Patient will demsotrate 5/5 gross right hip strength    Time 4   Period Weeks   Status Unable to assess     PT SHORT TERM GOAL #4   Title Patient will be independent with initial HEP    Baseline cues   Time 4   Period Weeks   Status On-going           PT Long  Term Goals - 10/23/16 1238      PT LONG TERM GOAL #1   Title Patient will put his right shoe on without difficulty    Time 8   Period Weeks   Status New     PT LONG TERM GOAL #2   Title Patient will ambualte 1 mile without increased fatigue    Time 8   Period Weeks   Status New     PT LONG TERM GOAL #3   Title Patient will go up a step in his house without difficulty    Time 8   Period Weeks   Status New               Plan - 11/03/16 1703    Clinical Impression Statement Short session due to patient was late.  He needs cues to keep on task.  No new exercises added.  No pain during session reported or observed. Focue today on ROM,  gait and strengthening.  No new objective findings noted.     PT Next Visit Plan continue with light manual hip stretching and reviewe HEP stretches, Consider quad set, SAQ, supine hip flexion, any type of hip abduction strengthening, 6 inch step up, standing hip flexion.    PT Home Exercise Plan lateral trunk rotation to improve hip ER/.IR, single knee to chest to improve hip flexion, Straight leg raise. Blue band issued at patient's request   Consulted and  Agree with Plan of Care Patient      Patient will benefit from skilled therapeutic intervention in order to improve the following deficits and impairments:  Abnormal gait, Decreased activity tolerance, Decreased strength, Decreased endurance, Decreased mobility, Difficulty walking, Decreased range of motion  Visit Diagnosis: Stiffness of right hip, not elsewhere classified  Difficulty in walking, not elsewhere classified  Muscle weakness (generalized)     Problem List Patient Active Problem List   Diagnosis Date Noted  . Closed displaced fracture of right femoral neck ([place]) 10/13/2016  . Mixed hyperlipidemia 06/24/2016  . Heel spur, unspecified laterality 06/24/2016    Morocco Gipe PTA 11/03/2016, 5:09 PM  [facility] [address] [place], [place], [address] Phone: [phone]   Fax:  [phone]  Name: Dalton Golden MRN: [id number] Date of Birth: 09/11/1928

## 2016-11-03 NOTE — Telephone Encounter (Signed)
Mailed to patients home today. I called and let him know.

## 2016-11-03 NOTE — Telephone Encounter (Signed)
Patient request a application for a handicap placard, to be mailed to his home once completed.

## 2016-11-05 ENCOUNTER — Ambulatory Visit: Payer: Medicare Other | Admitting: Physical Therapy

## 2016-11-05 ENCOUNTER — Encounter: Payer: Self-pay | Admitting: Physical Therapy

## 2016-11-05 DIAGNOSIS — M25651 Stiffness of right hip, not elsewhere classified: Secondary | ICD-10-CM

## 2016-11-05 DIAGNOSIS — R262 Difficulty in walking, not elsewhere classified: Secondary | ICD-10-CM | POA: Diagnosis not present

## 2016-11-05 DIAGNOSIS — M6281 Muscle weakness (generalized): Secondary | ICD-10-CM

## 2016-11-05 NOTE — Therapy (Signed)
[place], [place], [address] Phone: [phone]   Fax:  [phone]  Physical Therapy Treatment  Patient Details  Name: Dalton Golden MRN: [id number] Date of Birth: Aug 31, 1929 Referring Provider: Dr Frankey Shown   Encounter Date: 11/05/2016      PT End of Session - 11/05/16 1752    Visit Number 3   Number of Visits 16   Date for PT Re-Evaluation 12/17/16   PT Start Time 1503   PT Stop Time 7262   PT Time Calculation (min) 45 min   Activity Tolerance Patient tolerated treatment well   Behavior During Therapy [facility] for tasks assessed/performed      Past Medical History:  Diagnosis Date  . Hyperlipidemia     Past Surgical History:  Procedure Laterality Date  . HIP SURGERY Right 07/26/2016   [facility]    There were no vitals filed for this visit.      Subjective Assessment - 11/05/16 1516    Subjective I have been doing the home exercises.  No pain.   Currently in Pain? Yes                         [facility] Adult PT Treatment/Exercise - 11/05/16 0001      Ambulation/Gait   Pre-Gait Activities weights shifts at wall 5 X each.  unable to [facility] single leg and lift left foot. Close SBA     Knee/Hip Exercises: Stretches   Sports administrator 3 reps;30 seconds  right     Knee/Hip Exercises: Standing   Heel Raises 10 reps;2 sets  single 1 set, 1 set both   Forward Step Up Both;2 sets;10 reps;Hand Hold: 2;Step Height: 6"   Functional Squat 10 reps   Functional Squat Limitations cues  10 second holds     Knee/Hip Exercises: Supine   Bridges with Clamshell 10 reps  with blue band     Other Supine Knee/Hip Exercises clams with band 10 X     Manual Therapy   Manual Therapy Passive ROM   Passive ROM Right hip abduction, flexion ER ,  heavy cues to get patient to relax and Very gentle stretches                  PT Short Term Goals - 11/03/16 1706      PT  SHORT TERM GOAL #1   Title Patient will increase passive hip flexion on the right to 100 degrees    Time 4   Period Weeks   Status On-going     PT SHORT TERM GOAL #2   Title Patient will increase hip external rotation by 10 degrees    Time 4   Period Weeks   Status On-going     PT SHORT TERM GOAL #3   Title Patient will demsotrate 5/5 gross right hip strength    Time 4   Period Weeks   Status Unable to assess     PT SHORT TERM GOAL #4   Title Patient will be independent with initial HEP    Baseline cues   Time 4   Period Weeks   Status On-going           PT Long Term Goals - 10/23/16 1238      PT LONG TERM GOAL #1   Title Patient will put his right shoe on without difficulty    Time 8   Period Weeks  Status New     PT LONG TERM GOAL #2   Title Patient will ambualte 1 mile without increased fatigue    Time 8   Period Weeks   Status New     PT LONG TERM GOAL #3   Title Patient will go up a step in his house without difficulty    Time 8   Period Weeks   Status New               Plan - 11/05/16 1753    Clinical Impression Statement Strengthening astretchng focus.  Heavy cues for patient to stay on task and cued for tasks.  He tends to rush.   PT Next Visit Plan continue with light manual hip stretching and reviewe HEP stretches, Consider quad set, SAQ, supine hip flexion, any type of hip abduction strengthening, 6 inch step up, standing hip flexion.    PT Home Exercise Plan lateral trunk rotation to improve hip ER/.IR, single knee to chest to improve hip flexion, Straight leg raise. Blue band issued at patient's request   Consulted and Agree with Plan of Care Patient      Patient will benefit from skilled therapeutic intervention in order to improve the following deficits and impairments:  Abnormal gait, Decreased activity tolerance, Decreased strength, Decreased endurance, Decreased mobility, Difficulty walking, Decreased range of motion  Visit  Diagnosis: Stiffness of right hip, not elsewhere classified  Difficulty in walking, not elsewhere classified  Muscle weakness (generalized)     Problem List Patient Active Problem List   Diagnosis Date Noted  . Closed displaced fracture of right femoral neck ([place]) 10/13/2016  . Mixed hyperlipidemia 06/24/2016  . Heel spur, unspecified laterality 06/24/2016    Alleigh Mollica PTA 11/05/2016, 5:56 PM  [place], [place], [address] Phone: [phone]   Fax:  [phone]  Name: Dalton Golden MRN: [id number] Date of Birth: [birth year]

## 2016-11-10 ENCOUNTER — Ambulatory Visit: Payer: Medicare Other | Admitting: Physical Therapy

## 2016-11-10 ENCOUNTER — Encounter: Payer: Self-pay | Admitting: Physical Therapy

## 2016-11-10 DIAGNOSIS — R262 Difficulty in walking, not elsewhere classified: Secondary | ICD-10-CM

## 2016-11-10 DIAGNOSIS — M6281 Muscle weakness (generalized): Secondary | ICD-10-CM | POA: Diagnosis not present

## 2016-11-10 DIAGNOSIS — M25651 Stiffness of right hip, not elsewhere classified: Secondary | ICD-10-CM | POA: Diagnosis not present

## 2016-11-10 NOTE — Therapy (Signed)
[place], [place], [address] Phone: [phone]   Fax:  [phone]  Physical Therapy Treatment  Patient Details  Name: Dalton Golden MRN: [id number] Date of Birth: 1929/07/15 Referring Provider: Dr Frankey Shown   Encounter Date: 11/10/2016      PT End of Session - 11/10/16 1523    Visit Number 4   Number of Visits 16   Date for PT Re-Evaluation 12/17/16   Authorization Type Medicare   PT Start Time 0300   PT Stop Time 0343   PT Time Calculation (min) 43 min   Activity Tolerance Patient tolerated treatment well   Behavior During Therapy [facility] for tasks assessed/performed      Past Medical History:  Diagnosis Date  . Hyperlipidemia     Past Surgical History:  Procedure Laterality Date  . HIP SURGERY Right 07/26/2016   [facility]    There were no vitals filed for this visit.                       [facility] Adult PT Treatment/Exercise - 11/10/16 0001      Lumbar Exercises: Stretches   Single Knee to Chest Stretch Limitations 3x20sec hold    Lower Trunk Rotation Limitations x10     Knee/Hip Exercises: Stretches   Sports administrator 3 reps;30 seconds  right   Sports administrator Limitations off the edge of the table      Knee/Hip Exercises: Standing   Heel Raises 10 reps;2 sets  single 1 set, 1 set both   Knee Flexion --   Knee Flexion Limitations 2x10    Hip Flexion --   Hip Flexion Limitations 2x10    Forward Step Up Both;2 sets;10 reps;Hand Hold: 2;Step Height: 6"   Forward Step Up Limitations 6"    Functional Squat 10 reps   Functional Squat Limitations cues not to pull 2x10   10 second holds     Knee/Hip Exercises: Supine   Bridges Limitations 10 X   Bridges with Clamshell 10 reps  with blue band     Other Supine Knee/Hip Exercises clams with band 2x10 red      Manual Therapy   Manual Therapy Passive ROM   Passive ROM Right hip flexion ER ,  heavy cues to  get patient to relax and Very gentle stretches. Improved motion. Not as much stretching needed.                 PT Education - 11/10/16 1434    Education provided Yes   Education Details advised on the improtance of stretching    Person(s) Educated Patient   Methods Explanation;Demonstration;Tactile cues   Comprehension Verbalized understanding;Returned demonstration          PT Short Term Goals - 11/10/16 1530      PT SHORT TERM GOAL #1   Title Patient will increase passive hip flexion on the right to 100 degrees    Baseline past 100 degrees with no pain    Time 4   Period Weeks   Status Achieved     PT SHORT TERM GOAL #2   Title Patient will increase hip external rotation by 10 degrees    Baseline still some ER tightness    Time 4   Period Weeks   Status On-going     PT SHORT TERM GOAL #3   Title Patient will demsotrate 5/5 gross right hip strength    Baseline working  on strnegth    Time 4   Period Weeks   Status On-going     PT SHORT TERM GOAL #4   Title Patient will be independent with initial HEP    Baseline cues   Time 4   Period Weeks   Status On-going           PT Long Term Goals - 10/23/16 1238      PT LONG TERM GOAL #1   Title Patient will put his right shoe on without difficulty    Time 8   Period Weeks   Status New     PT LONG TERM GOAL #2   Title Patient will ambualte 1 mile without increased fatigue    Time 8   Period Weeks   Status New     PT LONG TERM GOAL #3   Title Patient will go up a step in his house without difficulty    Time 8   Period Weeks   Status New               Plan - 11/10/16 1524    Clinical Impression Statement Hip flexion as improved. He feels like he is having less trouble putting his shoe although he feels like he can not tie it very well yet. He had no pain with ther-ex.    Rehab Potential Good   PT Frequency 2x / week   PT Duration 8 weeks   PT Treatment/Interventions ADLs/Self Care Home  Management;Cryotherapy;Electrical Stimulation;Iontophoresis 4mg /ml Dexamethasone;Stair training;Gait training;Ultrasound;Moist Heat;Therapeutic activities;Therapeutic exercise;Patient/family education;Passive range of motion;Manual techniques;Taping;Splinting   PT Next Visit Plan continue with light manual hip stretching and reviewe HEP stretches, Consider quad set, SAQ, supine hip flexion, any type of hip abduction strengthening, 6 inch step up, standing hip flexion.    PT Home Exercise Plan lateral trunk rotation to improve hip ER/.IR, single knee to chest to improve hip flexion. Assess tolerance to ther-ex. Progress as tolerated.    Consulted and Agree with Plan of Care Patient      Patient will benefit from skilled therapeutic intervention in order to improve the following deficits and impairments:  Abnormal gait, Decreased activity tolerance, Decreased strength, Decreased endurance, Decreased mobility, Difficulty walking, Decreased range of motion  Visit Diagnosis: Stiffness of right hip, not elsewhere classified  Difficulty in walking, not elsewhere classified  Muscle weakness (generalized)     Problem List Patient Active Problem List   Diagnosis Date Noted  . Closed displaced fracture of right femoral neck ([place]) 10/13/2016  . Mixed hyperlipidemia 06/24/2016  . Heel spur, unspecified laterality 06/24/2016    Carney Living PT DPT  11/10/2016, 3:33 PM  [facility] [address] [place], [place], [address] Phone: [phone]   Fax:  [phone]  Name: Dalton Golden MRN: [id number] Date of Birth: 11-07-1928

## 2016-11-12 ENCOUNTER — Ambulatory Visit: Payer: Medicare Other | Admitting: Physical Therapy

## 2016-11-12 DIAGNOSIS — M6281 Muscle weakness (generalized): Secondary | ICD-10-CM | POA: Diagnosis not present

## 2016-11-12 DIAGNOSIS — M25651 Stiffness of right hip, not elsewhere classified: Secondary | ICD-10-CM | POA: Diagnosis not present

## 2016-11-12 DIAGNOSIS — R262 Difficulty in walking, not elsewhere classified: Secondary | ICD-10-CM

## 2016-11-12 NOTE — Therapy (Signed)
[place], [place], [address] Phone: [phone]   Fax:  [phone]  Physical Therapy Treatment  Patient Details  Name: Dalton Golden MRN: [id number] Date of Birth: 12-Nov-1928 Referring Provider: Dr Frankey Shown   Encounter Date: 11/12/2016      PT End of Session - 11/12/16 1427    Visit Number 5   Number of Visits 16   Date for PT Re-Evaluation 12/17/16   Authorization Type Medicare   PT Start Time 1425   PT Stop Time 1508   PT Time Calculation (min) 43 min   Activity Tolerance Patient tolerated treatment well   Behavior During Therapy [facility] for tasks assessed/performed      Past Medical History:  Diagnosis Date  . Hyperlipidemia     Past Surgical History:  Procedure Laterality Date  . HIP SURGERY Right 07/26/2016   [facility]    There were no vitals filed for this visit.      Subjective Assessment - 11/12/16 1428    Subjective Groin discomfort when trying to don shoes.    Currently in Pain? No/denies                         [facility] Adult PT Treatment/Exercise - 11/12/16 0001      Exercises   Exercises Knee/Hip     Knee/Hip Exercises: Stretches   Hip Flexor Stretch Limitations edge of bed illiopsoas stretch   Other Knee/Hip Stretches figure 4     Knee/Hip Exercises: Aerobic   Nustep L4 10 min      Knee/Hip Exercises: Standing   Heel Raises 20 reps   Hip Flexion Limitations standing marches without UE support   Abduction Limitations standing hip abduction with UE support     Knee/Hip Exercises: Supine   Short Arc Quad Sets Limitations bilateral legs, 5s hold with slow eccentric lower   Bridges with Clamshell 15 reps  red tband   Other Supine Knee/Hip Exercises hooklying clam red tband     Manual Therapy   Passive ROM R hip ROM all directions                  PT Short Term Goals - 11/10/16 1530      PT SHORT TERM GOAL #1   Title Patient will increase passive hip flexion on the right to 100 degrees    Baseline past 100 degrees with no pain    Time 4   Period Weeks   Status Achieved     PT SHORT TERM GOAL #2   Title Patient will increase hip external rotation by 10 degrees    Baseline still some ER tightness    Time 4   Period Weeks   Status On-going     PT SHORT TERM GOAL #3   Title Patient will demsotrate 5/5 gross right hip strength    Baseline working on strnegth    Time 4   Period Weeks   Status On-going     PT SHORT TERM GOAL #4   Title Patient will be independent with initial HEP    Baseline cues   Time 4   Period Weeks   Status On-going           PT Long Term Goals - 10/23/16 1238      PT LONG TERM GOAL #1   Title Patient will put his right shoe on without difficulty    Time 8  Period Weeks   Status New     PT LONG TERM GOAL #2   Title Patient will ambualte 1 mile without increased fatigue    Time 8   Period Weeks   Status New     PT LONG TERM GOAL #3   Title Patient will go up a step in his house without difficulty    Time 8   Period Weeks   Status New               Plan - 11/12/16 1512    Clinical Impression Statement Pt completed exercises without complaints of pain. Notable hip ER on R compared to L and lacking hip extension on R while ambulating. Verbalized "feeling it, not it doesn't hurt" pointing to R groin while performing bridges.    PT Next Visit Plan continue with light manual hip stretching and reviewe HEP stretches, Consider quad set, SAQ, supine hip flexion, any type of hip abduction strengthening, 6 inch step up, standing hip flexion.    PT Home Exercise Plan lateral trunk rotation to improve hip ER/.IR, single knee to chest to improve hip flexion. Assess tolerance to ther-ex. Progress as tolerated.    Consulted and Agree with Plan of Care Patient      Patient will benefit from skilled therapeutic intervention in order to improve the following  deficits and impairments:     Visit Diagnosis: Stiffness of right hip, not elsewhere classified  Muscle weakness (generalized)  Difficulty in walking, not elsewhere classified     Problem List Patient Active Problem List   Diagnosis Date Noted  . Closed displaced fracture of right femoral neck ([place]) 10/13/2016  . Mixed hyperlipidemia 06/24/2016  . Heel spur, unspecified laterality 06/24/2016    Kameran Lallier C. Cedric Denison PT, DPT 11/12/16 3:15 PM   [facility] [address] [place], [place], [address] Phone: [phone]   Fax:  [phone]  Name: Detron Carras MRN: [id number] Date of Birth: Feb 17, 1929

## 2016-11-17 ENCOUNTER — Encounter: Payer: Self-pay | Admitting: Physical Therapy

## 2016-11-17 ENCOUNTER — Ambulatory Visit: Payer: Medicare Other | Admitting: Physical Therapy

## 2016-11-17 DIAGNOSIS — R262 Difficulty in walking, not elsewhere classified: Secondary | ICD-10-CM

## 2016-11-17 DIAGNOSIS — M25651 Stiffness of right hip, not elsewhere classified: Secondary | ICD-10-CM

## 2016-11-17 DIAGNOSIS — M6281 Muscle weakness (generalized): Secondary | ICD-10-CM | POA: Diagnosis not present

## 2016-11-17 NOTE — Therapy (Signed)
[place], [place], [address] Phone: [phone]   Fax:  [phone]  Physical Therapy Treatment  Patient Details  Name: Dalton Golden MRN: [id number] Date of Birth: [birth year] Referring Provider: Dr Frankey Shown   Encounter Date: 11/17/2016      PT End of Session - 11/17/16 1618    Visit Number 6   Number of Visits 16   Date for PT Re-Evaluation 12/17/16   Authorization Type Medicare   PT Start Time 4268   PT Stop Time 1456   PT Time Calculation (min) 41 min   Activity Tolerance Patient tolerated treatment well   Behavior During Therapy [facility] for tasks assessed/performed      Past Medical History:  Diagnosis Date  . Hyperlipidemia     Past Surgical History:  Procedure Laterality Date  . HIP SURGERY Right 07/26/2016   [facility]    There were no vitals filed for this visit.      Subjective Assessment - 11/17/16 1438    Subjective The patient continues to have some stiffness in his groin. It has been improving. He is having less pain.                          [facility] Adult PT Treatment/Exercise - 11/17/16 0001      Lumbar Exercises: Stretches   Single Knee to Chest Stretch Limitations 3x20sec hold    Lower Trunk Rotation Limitations x10     Knee/Hip Exercises: Aerobic   Nustep L4 6 min      Knee/Hip Exercises: Standing   Heel Raises 20 reps   Hip Flexion Limitations standing marches without UE support   Abduction Limitations standing hip abduction with UE support   Forward Step Up Both;2 sets;10 reps;Hand Hold: 2;Step Height: 6"   Forward Step Up Limitations 6"    Functional Squat 10 reps   Functional Squat Limitations cues not to pull 2x10   10 second holds     Knee/Hip Exercises: Supine   Bridges with Clamshell 15 reps  red tband   Other Supine Knee/Hip Exercises hooklying clam red tband     Manual Therapy   Passive ROM R hip ROM all  directions                PT Education - 11/17/16 1617    Education provided Yes   Education Details Continue to work on Recruitment consultant) Educated Patient   Methods Explanation;Demonstration;Tactile cues   Comprehension Verbalized understanding;Returned demonstration          PT Short Term Goals - 11/10/16 1530      PT SHORT TERM GOAL #1   Title Patient will increase passive hip flexion on the right to 100 degrees    Baseline past 100 degrees with no pain    Time 4   Period Weeks   Status Achieved     PT SHORT TERM GOAL #2   Title Patient will increase hip external rotation by 10 degrees    Baseline still some ER tightness    Time 4   Period Weeks   Status On-going     PT SHORT TERM GOAL #3   Title Patient will demsotrate 5/5 gross right hip strength    Baseline working on strnegth    Time 4   Period Weeks   Status On-going     PT SHORT TERM GOAL #4   Title Patient  will be independent with initial HEP    Baseline cues   Time 4   Period Weeks   Status On-going           PT Long Term Goals - 10/23/16 1238      PT LONG TERM GOAL #1   Title Patient will put his right shoe on without difficulty    Time 8   Period Weeks   Status New     PT LONG TERM GOAL #2   Title Patient will ambualte 1 mile without increased fatigue    Time 8   Period Weeks   Status New     PT LONG TERM GOAL #3   Title Patient will go up a step in his house without difficulty    Time 8   Period Weeks   Status New               Plan - 11/17/16 1619    Clinical Impression Statement Patient is making great progress. His hip PROM is improivng. He feels a better ability to tie his shoes. He is still having stiffness. He is progressing towards all goals.    Rehab Potential Good   PT Frequency 2x / week   PT Duration 8 weeks   PT Treatment/Interventions ADLs/Self Care Home Management;Cryotherapy;Electrical Stimulation;Iontophoresis 4mg /ml Dexamethasone;Stair  training;Gait training;Ultrasound;Moist Heat;Therapeutic activities;Therapeutic exercise;Patient/family education;Passive range of motion;Manual techniques;Taping;Splinting   PT Next Visit Plan continue with light manual hip stretching and reviewe HEP stretches, Consider quad set, SAQ, supine hip flexion, any type of hip abduction strengthening, 6 inch step up, standing hip flexion.    PT Home Exercise Plan lateral trunk rotation to improve hip ER/.IR, single knee to chest to improve hip flexion. Assess tolerance to ther-ex. Progress as tolerated.    Consulted and Agree with Plan of Care Patient      Patient will benefit from skilled therapeutic intervention in order to improve the following deficits and impairments:  Abnormal gait, Decreased activity tolerance, Decreased strength, Decreased endurance, Decreased mobility, Difficulty walking, Decreased range of motion  Visit Diagnosis: Stiffness of right hip, not elsewhere classified  Muscle weakness (generalized)  Difficulty in walking, not elsewhere classified     Problem List Patient Active Problem List   Diagnosis Date Noted  . Closed displaced fracture of right femoral neck ([place]) 10/13/2016  . Mixed hyperlipidemia 06/24/2016  . Heel spur, unspecified laterality 06/24/2016    Dalton Golden  PT DPT  11/17/2016, 4:27 PM  [facility] [address] [place], [place], [address] Phone: [phone]   Fax:  [phone]  Name: Dalton Golden MRN: [id number] Date of Birth: 12-Mar-1929

## 2016-11-19 ENCOUNTER — Ambulatory Visit: Payer: Medicare Other | Admitting: Physical Therapy

## 2016-11-19 ENCOUNTER — Encounter: Payer: Self-pay | Admitting: Physical Therapy

## 2016-11-19 DIAGNOSIS — M25651 Stiffness of right hip, not elsewhere classified: Secondary | ICD-10-CM | POA: Diagnosis not present

## 2016-11-19 DIAGNOSIS — M6281 Muscle weakness (generalized): Secondary | ICD-10-CM

## 2016-11-19 DIAGNOSIS — R262 Difficulty in walking, not elsewhere classified: Secondary | ICD-10-CM | POA: Diagnosis not present

## 2016-11-19 NOTE — Therapy (Signed)
[place], [place], [address] Phone: [phone]   Fax:  [phone]  Physical Therapy Treatment  Patient Details  Name: Dalton Golden MRN: [id number] Date of Birth: October 23, 1928 Referring Provider: Dr Frankey Shown   Encounter Date: 11/19/2016      PT End of Session - 11/19/16 1500    Visit Number 7   Number of Visits 16   Date for PT Re-Evaluation 12/17/16   PT Start Time 1419   PT Stop Time 1500   PT Time Calculation (min) 41 min   Activity Tolerance Patient tolerated treatment well   Behavior During Therapy [facility] for tasks assessed/performed      Past Medical History:  Diagnosis Date  . Hyperlipidemia     Past Surgical History:  Procedure Laterality Date  . HIP SURGERY Right 07/26/2016   [facility]    There were no vitals filed for this visit.      Subjective Assessment - 11/19/16 1437    Subjective I can tie my bow on my shoes in the middle.  No pain   Currently in Pain? No/denies            [facility] PT Assessment - 11/19/16 0001      AROM   Right Hip Flexion --                     [name] - 11/19/16 0001      Lumbar Exercises: Stretches   Quad Stretch 3 reps;30 seconds  right     Knee/Hip Exercises: Aerobic   Nustep L5 6 minutes     Knee/Hip Exercises: Standing   Forward Step Up Both;2 sets;10 reps;Step Height: 6";Step Height: 8"   Functional Squat Limitations 5 feet hovering over mat     Knee/Hip Exercises: Supine   Quad Sets 10 reps;2 sets  HEP   Short Arc Quad Sets 10 reps;2 sets  HEP     Manual Therapy   Passive ROM R hip ROM all directions                PT Education - 11/19/16 1459    Education provided Yes   Education Details HEP   Person(s) Educated Patient   Methods Explanation;Demonstration;Verbal cues;Handout   Comprehension Verbalized understanding;Returned demonstration           PT Short Term Goals - 11/19/16 1619      PT SHORT TERM GOAL #1   Title Patient will increase passive hip flexion on the right to 100 degrees    Time 4   Period Weeks   Status Achieved     PT SHORT TERM GOAL #2   Title Patient will increase hip external rotation by 10 degrees    Time 4   Period Weeks   Status Unable to assess     PT SHORT TERM GOAL #3   Title Patient will demsotrate 5/5 gross right hip strength    Baseline Not yet 5/5   Time 4   Period Weeks   Status On-going     PT SHORT TERM GOAL #4   Title Patient will be independent with initial HEP    Time 4   Period Weeks   Status Unable to assess           PT Long Term Goals - 10/23/16 1238      PT LONG TERM GOAL #1   Title Patient will put his right shoe  on without difficulty    Time 8   Period Weeks   Status New     PT LONG TERM GOAL #2   Title Patient will ambualte 1 mile without increased fatigue    Time 8   Period Weeks   Status New     PT LONG TERM GOAL #3   Title Patient will go up a step in his house without difficulty    Time 8   Period Weeks   Status New               Plan - 11/19/16 1501    Clinical Impression Statement Progress toward HEP goals.  He ties his shoes with bow in the middle.  He needs the assist device to don his socks.   PT Next Visit Plan Review New exercises,  hip strengthening,   Abduction step ups and standing hip   PT Home Exercise Plan lateral trunk rotation to improve hip ER/.IR, single knee to chest to improve hip flexion. Assess tolerance to ther-ex. Progress as tolerated. QS, SAQ   Consulted and Agree with Plan of Care Patient      Patient will benefit from skilled therapeutic intervention in order to improve the following deficits and impairments:  Abnormal gait, Decreased activity tolerance, Decreased strength, Decreased endurance, Decreased mobility, Difficulty walking, Decreased range of motion  Visit Diagnosis: Stiffness of right hip, not  elsewhere classified  Muscle weakness (generalized)  Difficulty in walking, not elsewhere classified     Problem List Patient Active Problem List   Diagnosis Date Noted  . Closed displaced fracture of right femoral neck ([place]) 10/13/2016  . Mixed hyperlipidemia 06/24/2016  . Heel spur, unspecified laterality 06/24/2016    HARRIS,KAREN PTA 11/19/2016, 4:21 PM  [facility] [address] [place], [place], [address] Phone: [phone]   Fax:  [phone]  Name: Dalton Golden MRN: [id number] Date of Birth: 05-Jan-1929

## 2016-11-19 NOTE — Patient Instructions (Signed)
Quad Sets    Slowly tighten thigh muscles of straight, hold 5 seconds  _. Relax. Repeat _10-20___ times. Do _1KNEE: Extension, Short Arc Quads - Supine    Place bolster(or something firm) under knees. Raise one leg until knee is straight. _10-20__ reps., 3-4 ___ days per week   Copyright  VHI. All rights reserved.   http://gt2.exer.us/292   Copyright  VHI. All rights reserved.

## 2016-11-24 ENCOUNTER — Ambulatory Visit: Payer: Medicare Other | Admitting: Physical Therapy

## 2016-11-24 DIAGNOSIS — M6281 Muscle weakness (generalized): Secondary | ICD-10-CM | POA: Diagnosis not present

## 2016-11-24 DIAGNOSIS — M25651 Stiffness of right hip, not elsewhere classified: Secondary | ICD-10-CM | POA: Diagnosis not present

## 2016-11-24 DIAGNOSIS — R262 Difficulty in walking, not elsewhere classified: Secondary | ICD-10-CM

## 2016-11-24 NOTE — Therapy (Signed)
[place], [place], [address] Phone: [phone]   Fax:  [phone]  Physical Therapy Treatment  Patient Details  Name: Dalton Golden MRN: [id number] Date of Birth: Sep 12, 1928 Referring Provider: Dr Frankey Shown   Encounter Date: 11/24/2016      PT End of Session - 11/24/16 1421    Visit Number 8   Number of Visits 16   Date for PT Re-Evaluation 12/17/16   Authorization Type Medicare   PT Start Time 0215   PT Stop Time 0300   PT Time Calculation (min) 45 min      Past Medical History:  Diagnosis Date  . Hyperlipidemia     Past Surgical History:  Procedure Laterality Date  . HIP SURGERY Right 07/26/2016   [facility]    There were no vitals filed for this visit.      Subjective Assessment - 11/24/16 1422    Subjective I cant get my sock on by myself.    Currently in Pain? No/denies            [facility] PT Assessment - 11/24/16 0001      Observation/Other Assessments   Focus on Therapeutic Outcomes (FOTO)  42% limited improved from 55% limited on eval.      AROM   Right Hip Flexion 100   Right Hip External Rotation  --  65 PROM                     [name] - 11/24/16 0001      Knee/Hip Exercises: Aerobic   Nustep L5 6 minutes     Knee/Hip Exercises: Standing   Forward Step Up Both;2 sets;10 reps;Step Height: 6"   Functional Squat 10 reps   Other Standing Knee Exercises 3 way hip bilateral, 2 finger touch with SLS on right      Knee/Hip Exercises: Seated   Long Arc Quad 20 reps;Weights   Long Arc Quad Weight 5 lbs.   Sit to [employer] 10 reps  elevated seat      Knee/Hip Exercises: Supine   Straight Leg Raises 10 reps     Knee/Hip Exercises: Sidelying   Hip ABduction 10 reps     Manual Therapy   Passive ROM R hip ROM all directions                  PT Short Term Goals - 11/24/16 1512      PT SHORT TERM  GOAL #1   Title Patient will increase passive hip flexion on the right to 100 degrees    Baseline past 100 degrees with no pain    Time 4   Period Weeks   Status Achieved     PT SHORT TERM GOAL #2   Title Patient will increase hip external rotation by 10 degrees    Baseline still some ER tightness    Time 4   Period Weeks   Status On-going     PT SHORT TERM GOAL #3   Title Patient will demsotrate 5/5 gross right hip strength    Baseline Not yet 5/5   Time 4   Period Weeks   Status On-going     PT SHORT TERM GOAL #4   Title Patient will be independent with initial HEP    Baseline cues   Period Weeks   Status On-going           PT Long Term Goals -  11/24/16 1513      PT LONG TERM GOAL #1   Title Patient will put his right shoe on without difficulty    Baseline still difficulty with socks, able to cross right foot onto left knee   Time 8   Period Weeks   Status Partially Met     PT LONG TERM GOAL #2   Title Patient will ambualte 1 mile without increased fatigue    Baseline unsure, cant walk outside at home, walks around [employer] alot   Time 8   Period Weeks   Status Unable to assess     PT LONG TERM GOAL #3   Title Patient will go up a step in his house without difficulty    Baseline no problem with steps, some uncertainty with curbs    Time 8   Period Weeks   Status Achieved               Plan - 11/24/16 1514    Clinical Impression Statement Pt demonstrates improved ER and hip flexion after max cues to relax. He complains of pain at knee/ hamstring with PROM of hip. Hamstrings very tight. He did really well with stair negotiation however reports difficulty with low chairs and toilet seats. Worked on sit-stands focusing on control. Pt requires max cues to stay on task and slow down. LTG# 3 met.    PT Next Visit Plan Review New exercises,  hip strengthening,   Abduction step ups and standing hip.    PT Home Exercise Plan lateral trunk rotation to improve  hip ER/.IR, single knee to chest to improve hip flexion. Assess tolerance to ther-ex. Progress as tolerated. QS, SAQ   Consulted and Agree with Plan of Care Patient      Patient will benefit from skilled therapeutic intervention in order to improve the following deficits and impairments:  Abnormal gait, Decreased activity tolerance, Decreased strength, Decreased endurance, Decreased mobility, Difficulty walking, Decreased range of motion  Visit Diagnosis: Stiffness of right hip, not elsewhere classified  Muscle weakness (generalized)  Difficulty in walking, not elsewhere classified     Problem List Patient Active Problem List   Diagnosis Date Noted  . Closed displaced fracture of right femoral neck ([place]) 10/13/2016  . Mixed hyperlipidemia 06/24/2016  . Heel spur, unspecified laterality 06/24/2016    Dorene Ar, PTA 11/24/2016, 3:24 PM  [place], [place], [address] Phone: [phone]   Fax:  [phone]  Name: Dalton Golden MRN: [id number] Date of Birth: Aug 24, 1929

## 2016-11-26 ENCOUNTER — Ambulatory Visit: Payer: Medicare Other | Admitting: Physical Therapy

## 2016-11-26 ENCOUNTER — Encounter: Payer: Self-pay | Admitting: Physical Therapy

## 2016-11-26 DIAGNOSIS — M25651 Stiffness of right hip, not elsewhere classified: Secondary | ICD-10-CM | POA: Diagnosis not present

## 2016-11-26 DIAGNOSIS — M6281 Muscle weakness (generalized): Secondary | ICD-10-CM

## 2016-11-26 DIAGNOSIS — R262 Difficulty in walking, not elsewhere classified: Secondary | ICD-10-CM | POA: Diagnosis not present

## 2016-11-26 NOTE — Therapy (Signed)
[place], [place], [address] Phone: [phone]   Fax:  [phone]  Physical Therapy Treatment  Patient Details  Name: Dalton Golden MRN: [id number] Date of Birth: 10/31/1928 Referring Provider: Dr Frankey Shown   Encounter Date: 11/26/2016      PT End of Session - 11/26/16 1826    Visit Number 9   Number of Visits 16   Date for PT Re-Evaluation 12/17/16   PT Start Time 1418   PT Stop Time 1500   PT Time Calculation (min) 42 min   Activity Tolerance Patient tolerated treatment well   Behavior During Therapy [facility] for tasks assessed/performed      Past Medical History:  Diagnosis Date  . Hyperlipidemia     Past Surgical History:  Procedure Laterality Date  . HIP SURGERY Right 07/26/2016   [facility]    There were no vitals filed for this visit.      Subjective Assessment - 11/26/16 1432    Subjective My socks are still hard to get on.   I can bend my hip more.                         [facility] Adult PT Treatment/Exercise - 11/26/16 0001      Knee/Hip Exercises: Machines for Strengthening   Hip Cybex 1 plate 10 x each , hip flexion,  hip abduction SBA     Knee/Hip Exercises: Standing   Wall Squat 10 reps  cues,  fatigues after a few reps.     Knee/Hip Exercises: Supine   Straight Leg Raises 10 reps;AAROM   Other Supine Knee/Hip Exercises hooklying clam blue tband     Manual Therapy   Passive ROM R hip ROM abd, flex and ER                  PT Short Term Goals - 11/24/16 1512      PT SHORT TERM GOAL #1   Title Patient will increase passive hip flexion on the right to 100 degrees    Baseline past 100 degrees with no pain    Time 4   Period Weeks   Status Achieved     PT SHORT TERM GOAL #2   Title Patient will increase hip external rotation by 10 degrees    Baseline still some ER tightness    Time 4   Period Weeks   Status On-going     PT SHORT TERM GOAL #3   Title Patient will demsotrate 5/5 gross right hip strength    Baseline Not yet 5/5   Time 4   Period Weeks   Status On-going     PT SHORT TERM GOAL #4   Title Patient will be independent with initial HEP    Baseline cues   Period Weeks   Status On-going           PT Long Term Goals - 11/24/16 1513      PT LONG TERM GOAL #1   Title Patient will put his right shoe on without difficulty    Baseline still difficulty with socks, able to cross right foot onto left knee   Time 8   Period Weeks   Status Partially Met     PT LONG TERM GOAL #2   Title Patient will ambualte 1 mile without increased fatigue    Baseline unsure, cant walk outside at home, walks around [place]  Time 8   Period Weeks   Status Unable to assess     PT LONG TERM GOAL #3   Title Patient will go up a step in his house without difficulty    Baseline no problem with steps, some uncertainty with curbs    Time 8   Period Weeks   Status Achieved               Plan - 11/26/16 1828    Clinical Impression Statement Hip ROM today AROM 95 however it was hard to get him to relax.  He participates with all exercises however he quickly fatigues.  He continues to need cues to keep on task..  No new goals met.    PT Next Visit Plan Review New exercises,  hip strengthening,   Abduction step ups and standing hip. work on getting off lower surfaces.   PT Home Exercise Plan lateral trunk rotation to improve hip ER/.IR, single knee to chest to improve hip flexion. Assess tolerance to ther-ex. Progress as tolerated. QS, SAQ   Consulted and Agree with Plan of Care Patient      Patient will benefit from skilled therapeutic intervention in order to improve the following deficits and impairments:  Abnormal gait, Decreased activity tolerance, Decreased strength, Decreased endurance, Decreased mobility, Difficulty walking, Decreased range of motion  Visit Diagnosis: Stiffness of right  hip, not elsewhere classified  Muscle weakness (generalized)  Difficulty in walking, not elsewhere classified     Problem List Patient Active Problem List   Diagnosis Date Noted  . Closed displaced fracture of right femoral neck ([place]) 10/13/2016  . Mixed hyperlipidemia 06/24/2016  . Heel spur, unspecified laterality 06/24/2016    [facility] PTA 11/26/2016, 6:31 PM  [facility] [address] [place], [place], [address] Phone: [phone]   Fax:  [phone]  Name: Mihail Prettyman MRN: [id number] Date of Birth: Sep 23, 1928

## 2016-12-02 ENCOUNTER — Ambulatory Visit: Payer: Medicare Other | Attending: Internal Medicine | Admitting: Physical Therapy

## 2016-12-02 ENCOUNTER — Encounter: Payer: Self-pay | Admitting: Physical Therapy

## 2016-12-02 DIAGNOSIS — M6281 Muscle weakness (generalized): Secondary | ICD-10-CM | POA: Insufficient documentation

## 2016-12-02 DIAGNOSIS — R262 Difficulty in walking, not elsewhere classified: Secondary | ICD-10-CM | POA: Diagnosis not present

## 2016-12-02 DIAGNOSIS — M25651 Stiffness of right hip, not elsewhere classified: Secondary | ICD-10-CM | POA: Insufficient documentation

## 2016-12-02 NOTE — Therapy (Signed)
[place], [place], [address] Phone: [phone]   Fax:  [phone]  Physical Therapy Treatment  Patient Details  Name: Dalton Golden MRN: [id number] Date of Birth: Oct 18, 1928 Referring Provider: Dr Frankey Shown   Encounter Date: 12/02/2016      PT End of Session - 12/02/16 1818    Visit Number 10   Number of Visits 16   Date for PT Re-Evaluation 12/17/16   PT Start Time 8832   PT Stop Time 1500   PT Time Calculation (min) 43 min   Activity Tolerance Patient tolerated treatment well   Behavior During Therapy [facility] for tasks assessed/performed      Past Medical History:  Diagnosis Date  . Hyperlipidemia     Past Surgical History:  Procedure Laterality Date  . HIP SURGERY Right 07/26/2016   [facility]    There were no vitals filed for this visit.      Subjective Assessment - 12/02/16 1420    Subjective I have not used my cane today.     Currently in Pain? No/denies                         [facility] Adult PT Treatment/Exercise - 12/02/16 0001      High Level Balance   High Level Balance Comments narrow and wider base dynamic exercises CGA.  stepping over hurdle forward and to side requires min assist to keep from falling.     Knee/Hip Exercises: Aerobic   Nustep L5, 6 minutes     Knee/Hip Exercises: Machines for Strengthening   Total Gym Leg Press 10 X 1 plate 2 sets   Hip Cybex 1 plate 10 x each , hip flexion,  hip abduction SBA     Knee/Hip Exercises: Standing   Lateral Step Up Right;1 set;10 reps;Hand Hold: 1;Step Height: 6"   Forward Step Up Both;1 set;10 reps;Step Height: 2";Step Height: 6"   Functional Squat 10 reps     Knee/Hip Exercises: Supine   Straight Leg Raises 10 reps   Straight Leg Raises Limitations quad lag when unassisted,  AA   heavy cues   Other Supine Knee/Hip Exercises hooklying clam blue tband  10     Knee/Hip Exercises:  Sidelying   Clams 10  cues fro start position and hip position during     Manual Therapy   Passive ROM R hip ROM abd, flex and ER                  PT Short Term Goals - 12/02/16 1818      PT SHORT TERM GOAL #1   Title Patient will increase passive hip flexion on the right to 100 degrees    Baseline past 100 degrees with no pain    Time 4   Period Weeks   Status Achieved     PT SHORT TERM GOAL #2   Title Patient will increase hip external rotation by 10 degrees    Baseline still some ER tightness not formally measured   Time 4   Period Weeks   Status On-going     PT SHORT TERM GOAL #3   Title Patient will demsotrate 5/5 gross right hip strength    Baseline Not yet 5/5   Time 4   Period Weeks   Status On-going     PT SHORT TERM GOAL #4   Title Patient will be independent with initial HEP  Baseline independent with exercises issued so far.   Time 4   Period Weeks   Status On-going           PT Long Term Goals - 11/24/16 1513      PT LONG TERM GOAL #1   Title Patient will put his right shoe on without difficulty    Baseline still difficulty with socks, able to cross right foot onto left knee   Time 8   Period Weeks   Status Partially Met     PT LONG TERM GOAL #2   Title Patient will ambualte 1 mile without increased fatigue    Baseline unsure, cant walk outside at home, walks around [employer] alot   Time 8   Period Weeks   Status Unable to assess     PT LONG TERM GOAL #3   Title Patient will go up a step in his house without difficulty    Baseline no problem with steps, some uncertainty with curbs    Time 8   Period Weeks   Status Achieved             Patient will benefit from skilled therapeutic intervention in order to improve the following deficits and impairments:     Visit Diagnosis: Stiffness of right hip, not elsewhere classified  Muscle weakness (generalized)  Difficulty in walking, not elsewhere  classified     Problem List Patient Active Problem List   Diagnosis Date Noted  . Closed displaced fracture of right femoral neck ([place]) 10/13/2016  . Mixed hyperlipidemia 06/24/2016  . Heel spur, unspecified laterality 06/24/2016    Lakoda Mcanany PTA 12/02/2016, 6:23 PM  [facility] [address] [place], [place], [address] Phone: [phone]   Fax:  [phone]  Name: Dalton Golden MRN: [id number] Date of Birth: 1929/06/20

## 2016-12-03 ENCOUNTER — Ambulatory Visit: Payer: Medicare Other | Admitting: Physical Therapy

## 2016-12-03 ENCOUNTER — Encounter: Payer: Self-pay | Admitting: Physical Therapy

## 2016-12-03 DIAGNOSIS — M25651 Stiffness of right hip, not elsewhere classified: Secondary | ICD-10-CM | POA: Diagnosis not present

## 2016-12-03 DIAGNOSIS — R262 Difficulty in walking, not elsewhere classified: Secondary | ICD-10-CM | POA: Diagnosis not present

## 2016-12-03 DIAGNOSIS — M6281 Muscle weakness (generalized): Secondary | ICD-10-CM | POA: Diagnosis not present

## 2016-12-03 NOTE — Therapy (Signed)
[place], [place], [address] Phone: [phone]   Fax:  [phone]  Physical Therapy Treatment  Patient Details  Name: Dalton Golden MRN: [id number] Date of Birth: 07-03-1929 Referring Provider: Frankey Shown, MD  Encounter Date: 12/03/2016      PT End of Session - 12/03/16 1328    Visit Number 11   Number of Visits 16   Date for PT Re-Evaluation 12/17/16   Authorization Type Medicare   PT Start Time 1332   PT Stop Time 1413   PT Time Calculation (min) 41 min   Activity Tolerance Patient tolerated treatment well   Behavior During Therapy [name] for tasks assessed/performed      Past Medical History:  Diagnosis Date  . Hyperlipidemia     Past Surgical History:  Procedure Laterality Date  . HIP SURGERY Right 07/26/2016   [name]    There were no vitals filed for this visit.      Subjective Assessment - 12/03/16 1335    Subjective Reports hip is fine, does not use cane anymore.    Currently in Pain? 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   Overall Cognitive Status Within Functional Limits for tasks assessed     AROM   Right Hip Flexion 109   Right Hip External Rotation  40  L 70   Right Hip Internal Rotation  10     Strength   Right Hip Flexion 4+/5   Right Hip ABduction 4+/5                     [name] - 12/03/16 0001      Knee/Hip Exercises: Stretches   Other Knee/Hip Stretches figure 4  push     Knee/Hip Exercises: Aerobic   Nustep L5 8 min     Knee/Hip Exercises: Standing   SLS bilateral, on  airex, single UE support   Other Standing Knee Exercises slow marching on airex     Knee/Hip Exercises: Seated   Sit to [employer] 10 reps     Knee/Hip Exercises: Supine   Straight Leg Raises 15 reps   Straight Leg Raise with External Rotation 15 reps     Knee/Hip Exercises: Sidelying   Clams x30 R                PT Education - 12/03/16 1417    Education provided Yes   Education Details exercise form/rationale, measurements and progress   Person(s) Educated Patient   Methods Explanation;Demonstration;Tactile cues;Verbal cues   Comprehension Verbalized understanding;Returned demonstration;Verbal cues required;Tactile cues required;Need further instruction          PT Short Term Goals - 12/03/16 1337      PT SHORT TERM GOAL #1   Title Patient will increase passive hip flexion on the right to 100 degrees    Baseline past 100 degrees with no pain    Status Achieved     PT SHORT TERM GOAL #2   Title Patient will increase hip external  rotation by 10 degrees    Baseline see flowsheet   Status Achieved     PT SHORT TERM GOAL #3   Title Patient will demsotrate 5/5 gross right hip strength    Baseline see flowsheet   Status On-going     PT SHORT TERM GOAL #4   Title Patient will be independent with initial HEP    Baseline verbalized independence as it has been established at this time   Status Achieved           PT Long Term Goals - 11/24/16 1513      PT LONG TERM GOAL #1   Title Patient will put his right shoe on without difficulty    Baseline still difficulty with socks, able to cross right foot onto left knee   Time 8   Period Weeks   Status Partially Met     PT LONG TERM GOAL #2   Title Patient will ambualte 1 mile without increased fatigue    Baseline unsure, cant walk outside at home, walks around [employer] alot   Time 8   Period Weeks   Status Unable to assess     PT LONG TERM GOAL #3   Title Patient will go up a step in his house without difficulty     Baseline no problem with steps, some uncertainty with curbs    Time 8   Period Weeks   Status Achieved               Plan - 12/08/2016 1413    Clinical Impression Statement Pt did not coplain of any pain with exercises today. Is able to tie his shoe but "not perfectly" because knot is over to the L side. Difficulty clipping his toe nails. Will continue for 2 more weeks to address ROM and strength and continue to challenge.    PT Next Visit Plan Review New exercises,  hip strengthening,   Abduction step ups and standing hip. work on getting off lower surfaces.   PT Home Exercise Plan lateral trunk rotation to improve hip ER/.IR, single knee to chest to improve hip flexion. Assess tolerance to ther-ex. Progress as tolerated. QS, SAQ   Consulted and Agree with Plan of Care Patient      Patient will benefit from skilled therapeutic intervention in order to improve the following deficits and impairments:     Visit Diagnosis: Stiffness of right hip, not elsewhere classified  Muscle weakness (generalized)  Difficulty in walking, not elsewhere classified       G-Codes - 12/08/16 1416    Functional Assessment Tool Used (Outpatient Only) FOTO 47% ability, clinical decision making   Functional Limitation Mobility: Walking and moving around   Mobility: Walking and Moving Around Current Status ([id number]) At least 40 percent but less than 60 percent impaired, limited or restricted   Mobility: Walking and Moving Around Goal Status [phone]) At least 20 percent but less than 40 percent impaired, limited or restricted      Problem List Patient Active Problem List   Diagnosis Date Noted  . Closed displaced fracture of right femoral neck ([place]) 10/13/2016  . Mixed hyperlipidemia 06/24/2016  . Heel spur, unspecified laterality 06/24/2016    [place] Golden C. Kinsley Holderman PT, DPT 08-Dec-2016 2:18 PM   [facility] [address] [place], [place],  [address] Phone: [phone]   Fax:  [phone]  Name: Dalton Golden MRN: [id number] Date of Birth: Mar 16, 1929

## 2016-12-07 ENCOUNTER — Ambulatory Visit: Payer: Medicare Other | Admitting: Physical Therapy

## 2016-12-07 ENCOUNTER — Encounter: Payer: Self-pay | Admitting: Physical Therapy

## 2016-12-07 ENCOUNTER — Telehealth: Payer: Self-pay

## 2016-12-07 DIAGNOSIS — M6281 Muscle weakness (generalized): Secondary | ICD-10-CM

## 2016-12-07 DIAGNOSIS — M25651 Stiffness of right hip, not elsewhere classified: Secondary | ICD-10-CM | POA: Diagnosis not present

## 2016-12-07 DIAGNOSIS — R262 Difficulty in walking, not elsewhere classified: Secondary | ICD-10-CM

## 2016-12-07 NOTE — Therapy (Signed)
[place], [place], [address] Phone: [phone]   Fax:  [phone]  Physical Therapy Treatment  Patient Details  Name: Dalton Golden MRN: [id number] Date of Birth: [birth year] Referring Provider: Frankey Shown, MD  Encounter Date: 12/07/2016      PT End of Session - 12/07/16 1522    Visit Number 12   Number of Visits 16   Date for PT Re-Evaluation 12/17/16   PT Start Time 4235   PT Stop Time 1500   PT Time Calculation (min) 43 min   Activity Tolerance Patient tolerated treatment well   Behavior During Therapy [facility] for tasks assessed/performed      Past Medical History:  Diagnosis Date  . Hyperlipidemia     Past Surgical History:  Procedure Laterality Date  . HIP SURGERY Right 07/26/2016   [facility]    There were no vitals filed for this visit.      Subjective Assessment - 12/07/16 1424    Subjective No pain.  Still uses sock assist for right leg   Currently in Pain? No/denies                         [facility] Adult PT Treatment/Exercise - 12/07/16 0001      Knee/Hip Exercises: Stretches   Gastroc Stretch Both;3 reps   Gastroc Stretch Limitations incline board.     Knee/Hip Exercises: Aerobic   Nustep L5,  6 minutes     Knee/Hip Exercises: Standing   SLS 1 X each with UE support,  close SBA   Other Standing Knee Exercises slow marching on airex  30 seconds     Knee/Hip Exercises: Seated   Sit to Sand 10 reps     Knee/Hip Exercises: Supine   Bridges Limitations 15 X   Straight Leg Raises 15 reps   Straight Leg Raise with External Rotation 15 reps   Other Supine Knee/Hip Exercises hooklying clam blue tband  10     Knee/Hip Exercises: Sidelying   Clams x30 R                  PT Short Term Goals - 12/03/16 1337      PT SHORT TERM GOAL #1   Title Patient will increase passive hip flexion on the right to 100 degrees    Baseline past  100 degrees with no pain    Status Achieved     PT SHORT TERM GOAL #2   Title Patient will increase hip external rotation by 10 degrees    Baseline see flowsheet   Status Achieved     PT SHORT TERM GOAL #3   Title Patient will demsotrate 5/5 gross right hip strength    Baseline see flowsheet   Status On-going     PT SHORT TERM GOAL #4   Title Patient will be independent with initial HEP    Baseline verbalized independence as it has been established at this time   Status Achieved           PT Long Term Goals - 11/24/16 1513      PT LONG TERM GOAL #1   Title Patient will put his right shoe on without difficulty    Baseline still difficulty with socks, able to cross right foot onto left knee   Time 8   Period Weeks   Status Partially Met     PT LONG TERM GOAL #2   Title  Patient will ambualte 1 mile without increased fatigue    Baseline unsure, cant walk outside at home, walks around [employer] alot   Time 8   Period Weeks   Status Unable to assess     PT LONG TERM GOAL #3   Title Patient will go up a step in his house without difficulty    Baseline no problem with steps, some uncertainty with curbs    Time 8   Period Weeks   Status Achieved               Plan - 12/07/16 1523    Clinical Impression Statement Strengthening. balance and ROM focus today.  Needs hand assist with SLS on foam pad.   PT Next Visit Plan Review New exercises,  hip strengthening,   Abduction step ups and standing hip. work on getting off lower surfaces.   PT Home Exercise Plan lateral trunk rotation to improve hip ER/.IR, single knee to chest to improve hip flexion. Assess tolerance to ther-ex. Progress as tolerated. QS, SAQ   Consulted and Agree with Plan of Care Patient      Patient will benefit from skilled therapeutic intervention in order to improve the following deficits and impairments:  Abnormal gait, Decreased activity tolerance, Decreased strength, Decreased endurance, Decreased  mobility, Difficulty walking, Decreased range of motion  Visit Diagnosis: Stiffness of right hip, not elsewhere classified  Muscle weakness (generalized)  Difficulty in walking, not elsewhere classified     Problem List Patient Active Problem List   Diagnosis Date Noted  . Closed displaced fracture of right femoral neck ([place]) 10/13/2016  . Mixed hyperlipidemia 06/24/2016  . Heel spur, unspecified laterality 06/24/2016    HARRIS,KAREN PTA 12/07/2016, 3:25 PM  [facility] [address] [place], [place], [address] Phone: [phone]   Fax:  [phone]  Name: Mikiah Demond MRN: [id number] Date of Birth: [birth year]

## 2016-12-07 NOTE — Telephone Encounter (Signed)
Patient called requesting a referral to a podiatry to have his toe nails cut, patient is unable to do so himself due to mobility limits( unable to bend knee)   Please advise

## 2016-12-07 NOTE — Telephone Encounter (Signed)
Refer to [facility] on [facility]. Dr. Marlena Clipper or partner.

## 2016-12-09 NOTE — Telephone Encounter (Signed)
Called patient because he broke his right hip 07/24/16 had ORIF 07/26/16 he can't bend over to trim his toe nails.

## 2016-12-10 ENCOUNTER — Other Ambulatory Visit: Payer: Self-pay | Admitting: Internal Medicine

## 2016-12-10 ENCOUNTER — Ambulatory Visit: Payer: Medicare Other | Admitting: Physical Therapy

## 2016-12-10 DIAGNOSIS — M25651 Stiffness of right hip, not elsewhere classified: Secondary | ICD-10-CM | POA: Diagnosis not present

## 2016-12-10 DIAGNOSIS — M6281 Muscle weakness (generalized): Secondary | ICD-10-CM | POA: Diagnosis not present

## 2016-12-10 DIAGNOSIS — S72001A Fracture of unspecified part of neck of right femur, initial encounter for closed fracture: Secondary | ICD-10-CM

## 2016-12-10 DIAGNOSIS — R262 Difficulty in walking, not elsewhere classified: Secondary | ICD-10-CM | POA: Diagnosis not present

## 2016-12-10 DIAGNOSIS — L602 Onychogryphosis: Secondary | ICD-10-CM | POA: Insufficient documentation

## 2016-12-10 NOTE — Therapy (Addendum)
[place], [place], [address] Phone: [phone]   Fax:  [phone]  Physical Therapy Treatment/ Discharge   Patient Details  Name: Dalton Golden MRN: [id number] Date of Birth: 1929-02-06 Referring Provider: Frankey Shown, MD  Encounter Date: 12/10/2016      PT End of Session - 12/10/16 1416    Visit Number 13   Number of Visits 16   Date for PT Re-Evaluation 12/17/16   Authorization Type Medicare   PT Start Time 1415   PT Stop Time 1500   PT Time Calculation (min) 45 min   Activity Tolerance Patient tolerated treatment well   Behavior During Therapy [facility] for tasks assessed/performed      Past Medical History:  Diagnosis Date  . Hyperlipidemia     Past Surgical History:  Procedure Laterality Date  . HIP SURGERY Right 07/26/2016   [facility]    There were no vitals filed for this visit.          [facility] PT Assessment - 12/10/16 0001      AROM   Right Hip Flexion 110   Right Hip External Rotation  45     Strength   Right Hip Flexion 5/5   Right Hip ABduction 5/5                     [name] - 12/10/16 0001      Knee/Hip Exercises: Stretches   Gastroc Stretch Both;3 reps   Gastroc Stretch Limitations incline board.     Knee/Hip Exercises: Aerobic   Nustep L5,  6 minutes     Knee/Hip Exercises: Machines for Strengthening   Total Gym Leg Press 10 X 1 plate 2 sets     Knee/Hip Exercises: Standing   SLS 1 X each with UE support,  close SBA   Other Standing Knee Exercises slow marching with emphasis on right hip flexion   30 seconds   Other Standing Knee Exercises hip abduction 2x10 each leg; hip extension 2x10 each leg     Knee/Hip Exercises: Seated   Sit to Sand 10 reps     Knee/Hip Exercises: Supine   Bridges Limitations 2x10   Straight Leg Raises Limitations 2x10   Straight Leg Raise with External Rotation  Limitations 2x10     Knee/Hip Exercises: Sidelying   Clams x30 R      Manual Therapy   Passive ROM R hip ROM abd, flex and ER; measurements taken for discahrge.                   PT Short Term Goals - 12/10/16 1432      PT SHORT TERM GOAL #1   Title Patient will increase passive hip flexion on the right to 100 degrees    Baseline 110 degrees    Time 4   Period Weeks   Status Achieved     PT SHORT TERM GOAL #2   Title Patient will increase hip external rotation by 10 degrees    Baseline 45 degrees    Time 4   Period Weeks   Status Achieved     PT SHORT TERM GOAL #3   Title Patient will demsotrate 5/5 gross right hip strength    Time 4   Period Weeks   Status Achieved     PT SHORT TERM GOAL #4   Title Patient will be independent with initial HEP    Baseline Patient  knows his exercises well    Time 4   Period Weeks   Status Achieved           PT Long Term Goals - 12/18/2016 1434      PT LONG TERM GOAL #1   Title Patient will put his right shoe on without difficulty    Baseline can put his shoes and socks on    Time 8   Period Weeks   Status Achieved     PT LONG TERM GOAL #2   Title Patient will ambualte 1 mile without increased fatigue    Baseline Can walk around long storeswithout a pain    Time 8   Period Weeks   Status Achieved     PT LONG TERM GOAL #3   Title Patient will go up a step in his house without difficulty    Baseline no problem with steps, some uncertainty with curbs    Time 8   Period Weeks   Status Achieved               Plan - 18-Dec-2016 1558    Clinical Impression Statement Patient has met all goals for therapy. He is having no pain with ambulation. He can put his shoes and sox on. He is able to get in and out of the ath tub. He feels comofortable with his HEP. D/C to HEP at this time.    Rehab Potential Good   PT Frequency 2x / week   PT Duration 8 weeks   PT Treatment/Interventions ADLs/Self Care Home  Management;Cryotherapy;Electrical Stimulation;Iontophoresis '4mg'$ /ml Dexamethasone;Stair training;Gait training;Ultrasound;Moist Heat;Therapeutic activities;Therapeutic exercise;Patient/family education;Passive range of motion;Manual techniques;Taping;Splinting   PT Next Visit Plan D/C   PT Home Exercise Plan D/C    Consulted and Agree with Plan of Care Patient      Patient will benefit from skilled therapeutic intervention in order to improve the following deficits and impairments:  Abnormal gait, Decreased activity tolerance, Decreased strength, Decreased endurance, Decreased mobility, Difficulty walking, Decreased range of motion  Visit Diagnosis: Stiffness of right hip, not elsewhere classified  Muscle weakness (generalized)  Difficulty in walking, not elsewhere classified       G-Codes - Dec 18, 2016 1600    Functional Assessment Tool Used (Outpatient Only) FOTO 38% ability, clinical decision making   Functional Limitation Mobility: Walking and moving around   Mobility: Walking and Moving Around Current Status [phone]) At least 40 percent but less than 60 percent impaired, limited or restricted   Mobility: Walking and Moving Around Goal Status [phone]) At least 20 percent but less than 40 percent impaired, limited or restricted   Mobility: Walking and Moving Around Discharge Status [phone]) At least 20 percent but less than 40 percent impaired, limited or restricted     PHYSICAL THERAPY DISCHARGE SUMMARY  Visits from Start of Care: 13  Current functional level related to goals / functional outcomes: 13  Remaining deficits: None  Education / Equipment: HEP Plan: Patient agrees to discharge.  Patient goals were not met. Patient is being discharged due to meeting the stated rehab goals.  ?????      Problem List Patient Active Problem List   Diagnosis Date Noted  . Overgrown toenails 2016/12/18  . Closed displaced fracture of right femoral neck ([place]) 10/13/2016  . Mixed  hyperlipidemia 06/24/2016  . Heel spur, unspecified laterality 06/24/2016    Carney Living PT DPT  12/18/2016, 4:00 PM  [place], [place],  [address] Phone: [phone]   Fax:  [phone]  Name: [place] Golden MRN: [id number] Date of Birth: 29-Dec-1928

## 2016-12-10 NOTE — Telephone Encounter (Signed)
Debbie please consult with Dr.Green and complete referral, I do not know what diagnosis to associate it with

## 2016-12-10 NOTE — Telephone Encounter (Signed)
Noted  

## 2016-12-10 NOTE — Telephone Encounter (Signed)
Order for podiatry referral entered. Dx: overgrown toenail + fracture of hip

## 2016-12-22 ENCOUNTER — Ambulatory Visit (INDEPENDENT_AMBULATORY_CARE_PROVIDER_SITE_OTHER): Payer: Medicare Other

## 2016-12-22 ENCOUNTER — Other Ambulatory Visit: Payer: Medicare Other

## 2016-12-22 VITALS — BP 110/64 | HR 64 | Temp 97.7°F | Ht 65.0 in | Wt 139.0 lb

## 2016-12-22 DIAGNOSIS — Z0000 Encounter for general adult medical examination without abnormal findings: Secondary | ICD-10-CM

## 2016-12-22 DIAGNOSIS — Z79899 Other long term (current) drug therapy: Secondary | ICD-10-CM | POA: Diagnosis not present

## 2016-12-22 DIAGNOSIS — M7732 Calcaneal spur, left foot: Secondary | ICD-10-CM

## 2016-12-22 DIAGNOSIS — E782 Mixed hyperlipidemia: Secondary | ICD-10-CM | POA: Diagnosis not present

## 2016-12-22 DIAGNOSIS — Y93A3 Activity, aerobic and step exercise: Secondary | ICD-10-CM | POA: Diagnosis not present

## 2016-12-22 LAB — LIPID PANEL
CHOL/HDL RATIO: 2.2 ratio (ref <5.0)
Cholesterol: 156 mg/dL (ref <200)
HDL: 72 mg/dL (ref >40)
LDL CALC: 71 mg/dL (ref <100)
Triglycerides: 67 mg/dL (ref <150)
VLDL: 13 mg/dL (ref <30)

## 2016-12-22 LAB — CBC WITH DIFFERENTIAL/PLATELET
BASOS ABS: 0 {cells}/uL (ref 0–200)
Basophils Relative: 0 %
EOS PCT: 3 %
Eosinophils Absolute: 225 cells/uL (ref 15–500)
HCT: 43.2 % (ref 38.5–50.0)
HEMOGLOBIN: 14.6 g/dL (ref 13.2–17.1)
Lymphocytes Relative: 28 %
Lymphs Abs: 2100 cells/uL (ref 850–3900)
MCH: 30.6 pg (ref 27.0–33.0)
MCHC: 33.8 g/dL (ref 32.0–36.0)
MCV: 90.6 fL (ref 80.0–100.0)
MONOS PCT: 9 %
MPV: 9.1 fL (ref 7.5–12.5)
Monocytes Absolute: 675 cells/uL (ref 200–950)
NEUTROS PCT: 60 %
Neutro Abs: 4500 cells/uL (ref 1500–7800)
PLATELETS: 237 10*3/uL (ref 140–400)
RBC: 4.77 MIL/uL (ref 4.20–5.80)
RDW: 15 % (ref 11.0–15.0)
WBC: 7.5 10*3/uL (ref 3.8–10.8)

## 2016-12-22 LAB — COMPLETE METABOLIC PANEL WITH GFR
ALT: 18 U/L (ref 9–46)
AST: 18 U/L (ref 10–35)
Albumin: 4.2 g/dL (ref 3.6–5.1)
Alkaline Phosphatase: 60 U/L (ref 40–115)
BUN: 23 mg/dL (ref 7–25)
CO2: 31 mmol/L (ref 20–31)
Calcium: 9.1 mg/dL (ref 8.6–10.3)
Chloride: 104 mmol/L (ref 98–110)
Creat: 0.85 mg/dL (ref 0.70–1.11)
GFR, EST NON AFRICAN AMERICAN: 79 mL/min (ref >=60)
GFR, Est African American: >89 mL/min (ref >=60)
GLUCOSE: 118 mg/dL — AB (ref 65–99)
POTASSIUM: 4.5 mmol/L (ref 3.5–5.3)
SODIUM: 142 mmol/L (ref 135–146)
Total Bilirubin: 0.6 mg/dL (ref 0.2–1.2)
Total Protein: 6.6 g/dL (ref 6.1–8.1)

## 2016-12-22 LAB — TSH: TSH: 2.95 m[IU]/L (ref 0.40–4.50)

## 2016-12-22 NOTE — Progress Notes (Signed)
Subjective:   Dalton Golden is a 83 y.o. male who presents for an Initial Medicare Annual Wellness Visit.     Objective:    Today's Vitals   12/22/16 0819  BP: 110/64  Pulse: 64  Temp: 97.7 F (36.5 C)  TempSrc: Oral  SpO2: 95%  Weight: 139 lb (63 kg)  Height: 5\' 5"  (1.651 m)   Body mass index is 23.13 kg/m.  Current Medications (verified) Outpatient Encounter Prescriptions as of 12/22/2016  Medication Sig  . aspirin 81 MG EC tablet Take 81 mg by mouth daily. Swallow whole.  Marland Kitchen atorvastatin (LIPITOR) 40 MG tablet Take 20 mg by mouth daily. High Cholesterol  . Docusate Calcium (STOOL SOFTENER PO) Take 1 tablet by mouth daily.  . Multiple Vitamin (ONE-A-DAY MENS PO) Take by mouth daily.  . tamsulosin (FLOMAX) 0.4 MG CAPS capsule Take 0.4 mg by mouth daily.   No facility-administered encounter medications on file as of 12/22/2016.     Allergies (verified) Patient has no known allergies.   History: Past Medical History:  Diagnosis Date  . Hyperlipidemia    Past Surgical History:  Procedure Laterality Date  . HIP SURGERY Right 07/26/2016   [facility]   Family History  Problem Relation Age of Onset  . Heart attack Brother   . Heart disease Brother    Social History   Occupational History  . Not on file.   Social History Main Topics  . Smoking status: Former Smoker    Packs/day: 1.00    Years: 25.00    Types: Cigarettes    Quit date: 08/31/1958  . Smokeless tobacco: Never Used  . Alcohol use Yes     Comment: 1 drink every night  . Drug use: No  . Sexual activity: Not on file   Tobacco Counseling Counseling given: Not Answered   Activities of Daily Living No flowsheet data found.  Immunizations and Health Maintenance Immunization History  Administered Date(s) Administered  . Influenza-Unspecified 06/03/2016   Health Maintenance Due  Topic Date Due  . TETANUS/TDAP  11/09/1949  . PNA vac Low Risk Adult (1 of 2 - PCV13)  11/10/1995    Patient Care Team: Gildardo Cranker, DO as PCP - General (Internal Medicine)  Indicate any recent Medical Services you may have received from other than [facility] providers in the past year (date may be approximate).    Assessment:   This is a routine wellness examination for Dalton Golden.   Hearing/Vision screen No exam data present  Dietary issues and exercise activities discussed:    Goals    None     Depression Screen PHQ 2/9 Scores 06/24/2016  PHQ - 2 Score 0    Fall Risk Fall Risk  10/07/2016 06/24/2016  Falls in the past year? Yes No  Number falls in past yr: 1 -  Injury with Fall? Yes -    Cognitive Function:        Screening Tests Health Maintenance  Topic Date Due  . TETANUS/TDAP  11/09/1949  . PNA vac Low Risk Adult (1 of 2 - PCV13) 11/10/1995  . INFLUENZA VACCINE  03/31/2017        Plan:  I have personally reviewed and addressed the Medicare Annual Wellness questionnaire and have noted the following in the patient's chart:  A. Medical and social history B. Use of alcohol, tobacco or illicit drugs  C. Current medications and supplements D. Functional ability and status E.  Nutritional status F.  Physical activity G. Advance  directives H. List of other physicians I.  Hospitalizations, surgeries, and ER visits in previous 12 months J.  [place] to include hearing, vision, cognitive, depression L. Referrals and appointments - none  In addition, I have reviewed and discussed with patient certain preventive protocols, quality metrics, and best practice recommendations. A written personalized care plan for preventive services as well as general preventive health recommendations were provided to patient.  See attached scanned questionnaire for additional information.   Signed,   Rich Reining, RN Nurse Health Advisor   I have reviewed the information entered by the [facility]. I was present in the office during the time of  patient interaction and was available for consultation. I agree with the documentation and advice.  Viviann Spare Nyoka Cowden, MD

## 2016-12-22 NOTE — Patient Instructions (Addendum)
[name] , Thank you for taking time to come for your Medicare Wellness Visit. I appreciate your ongoing commitment to your health goals. Please review the following plan we discussed and let me know if I can assist you in the future.   Screening recommendations/referrals: Colonoscopy up to date Recommended yearly ophthalmology/optometry visit for glaucoma screening and checkup Recommended yearly dental visit for hygiene and checkup  Vaccinations: Influenza vaccine up to date Pneumococcal vaccine up to date. Please bring [place] records if you have them at home Tdap vaccine due Shingles vaccine due    Advanced directives: In Chart  Conditions/risks identified: None  Next appointment: Dr. Eulas Golden 5/30 @ 8:45am  Preventive Care 65 Years and Older, Male Preventive care refers to lifestyle choices and visits with your health care provider that can promote health and wellness. What does preventive care include?  A yearly physical exam. This is also called an annual well check.  Dental exams once or twice a year.  Routine eye exams. Ask your health care provider how often you should have your eyes checked.  Personal lifestyle choices, including:  Daily care of your teeth and gums.  Regular physical activity.  Eating a healthy diet.  Avoiding tobacco and drug use.  Limiting alcohol use.  Practicing safe sex.  Taking low doses of aspirin every day.  Taking vitamin and mineral supplements as recommended by your health care provider. What happens during an annual well check? The services and screenings done by your health care provider during your annual well check will depend on your age, overall health, lifestyle risk factors, and family history of disease. Counseling  Your health care provider may ask you questions about your:  Alcohol use.  Tobacco use.  Drug use.  Emotional well-being.  Home and relationship well-being.  Sexual activity.  Eating  habits.  History of falls.  Memory and ability to understand (cognition).  Work and work Statistician. Screening  You may have the following tests or measurements:  Height, weight, and BMI.  Blood pressure.  Lipid and cholesterol levels. These may be checked every 5 years, or more frequently if you are over 75 years old.  Skin check.  Lung cancer screening. You may have this screening every year starting at age 55 if you have a 30-pack-year history of smoking and currently smoke or have quit within the past 15 years.  Fecal occult blood test (FOBT) of the stool. You may have this test every year starting at age 61.  Flexible sigmoidoscopy or colonoscopy. You may have a sigmoidoscopy every 5 years or a colonoscopy every 10 years starting at age 36.  Prostate cancer screening. Recommendations will vary depending on your family history and other risks.  Hepatitis C blood test.  Hepatitis B blood test.  Sexually transmitted disease (STD) testing.  Diabetes screening. This is done by checking your blood sugar (glucose) after you have not eaten for a while (fasting). You may have this done every 1-3 years.  Abdominal aortic aneurysm (AAA) screening. You may need this if you are a current or former smoker.  Osteoporosis. You may be screened starting at age 25 if you are at high risk. Talk with your health care provider about your test results, treatment options, and if necessary, the need for more tests. Vaccines  Your health care provider may recommend certain vaccines, such as:  Influenza vaccine. This is recommended every year.  Tetanus, diphtheria, and acellular pertussis (Tdap, Td) vaccine. You may need a Td booster  every 10 years.  Zoster vaccine. You may need this after age 47.  Pneumococcal 13-valent conjugate (PCV13) vaccine. One dose is recommended after age 67.  Pneumococcal polysaccharide (PPSV23) vaccine. One dose is recommended after age 79. Talk to your health  care provider about which screenings and vaccines you need and how often you need them. This information is not intended to replace advice given to you by your health care provider. Make sure you discuss any questions you have with your health care provider. Document Released: 09/13/2015 Document Revised: 05/06/2016 Document Reviewed: 06/18/2015 Elsevier Interactive Patient Education  2017 [place] Prevention in the Home Falls can cause injuries. They can happen to people of all ages. There are many things you can do to make your home safe and to help prevent falls. What can I do on the outside of my home?  Regularly fix the edges of walkways and driveways and fix any cracks.  Remove anything that might make you trip as you walk through a door, such as a raised step or threshold.  Trim any bushes or trees on the path to your home.  Use bright outdoor lighting.  Clear any walking paths of anything that might make someone trip, such as rocks or tools.  Regularly check to see if handrails are loose or broken. Make sure that both sides of any steps have handrails.  Any raised decks and porches should have guardrails on the edges.  Have any leaves, snow, or ice cleared regularly.  Use sand or salt on walking paths during winter.  Clean up any spills in your garage right away. This includes oil or grease spills. What can I do in the bathroom?  Use night lights.  Install grab bars by the toilet and in the tub and shower. Do not use towel bars as grab bars.  Use non-skid mats or decals in the tub or shower.  If you need to sit down in the shower, use a plastic, non-slip stool.  Keep the floor dry. Clean up any water that spills on the floor as soon as it happens.  Remove soap buildup in the tub or shower regularly.  Attach bath mats securely with double-sided non-slip rug tape.  Do not have throw rugs and other things on the floor that can make you trip. What can I do  in the bedroom?  Use night lights.  Make sure that you have a light by your bed that is easy to reach.  Do not use any sheets or blankets that are too big for your bed. They should not hang down onto the floor.  Have a firm chair that has side arms. You can use this for support while you get dressed.  Do not have throw rugs and other things on the floor that can make you trip. What can I do in the kitchen?  Clean up any spills right away.  Avoid walking on wet floors.  Keep items that you use a lot in easy-to-reach places.  If you need to reach something above you, use a strong step stool that has a grab bar.  Keep electrical cords out of the way.  Do not use floor polish or wax that makes floors slippery. If you must use wax, use non-skid floor wax.  Do not have throw rugs and other things on the floor that can make you trip. What can I do with my stairs?  Do not leave any items on the stairs.  Make  sure that there are handrails on both sides of the stairs and use them. Fix handrails that are broken or loose. Make sure that handrails are as long as the stairways.  Check any carpeting to make sure that it is firmly attached to the stairs. Fix any carpet that is loose or worn.  Avoid having throw rugs at the top or bottom of the stairs. If you do have throw rugs, attach them to the floor with carpet tape.  Make sure that you have a light switch at the top of the stairs and the bottom of the stairs. If you do not have them, ask someone to add them for you. What else can I do to help prevent falls?  Wear shoes that:  Do not have high heels.  Have rubber bottoms.  Are comfortable and fit you well.  Are closed at the toe. Do not wear sandals.  If you use a stepladder:  Make sure that it is fully opened. Do not climb a closed stepladder.  Make sure that both sides of the stepladder are locked into place.  Ask someone to hold it for you, if possible.  Clearly mark  and make sure that you can see:  Any grab bars or handrails.  First and last steps.  Where the edge of each step is.  Use tools that help you move around (mobility aids) if they are needed. These include:  Canes.  Walkers.  Scooters.  Crutches.  Turn on the lights when you go into a dark area. Replace any light bulbs as soon as they burn out.  Set up your furniture so you have a clear path. Avoid moving your furniture around.  If any of your floors are uneven, fix them.  If there are any pets around you, be aware of where they are.  Review your medicines with your doctor. Some medicines can make you feel dizzy. This can increase your chance of falling. Ask your doctor what other things that you can do to help prevent falls. This information is not intended to replace advice given to you by your health care provider. Make sure you discuss any questions you have with your health care provider. Document Released: 06/13/2009 Document Revised: 01/23/2016 Document Reviewed: 09/21/2014 Elsevier Interactive Patient Education  2017 [employer].

## 2016-12-22 NOTE — Progress Notes (Signed)
Quick Notes   Health Maintenance: TDAP Due.     Abnormal Screen: MMSE 28/30. Did not pass clock drawing    Patient Concerns:  L heel spur has been hurting, putting in referral for podiatrist.     Nurse Concerns: None I have reviewed the information entered by the Health Advisor. I was present in the office during the time of patient interaction and was available for consultation. I agree with the documentation and advice.  Viviann Spare Nyoka Cowden, MD

## 2016-12-25 ENCOUNTER — Ambulatory Visit: Payer: Medicare Other | Admitting: Internal Medicine

## 2016-12-28 ENCOUNTER — Telehealth: Payer: Self-pay | Admitting: *Deleted

## 2016-12-28 DIAGNOSIS — M779 Enthesopathy, unspecified: Secondary | ICD-10-CM

## 2016-12-28 NOTE — Telephone Encounter (Addendum)
Pt states he would like to get a call to see about coming into have his heel spur looked at.01/08/2017-Pt called states there is a discrepancy in his appt time for 01/11/2017, please call.02/08/2017-Pt choose to be seen at [facility], instead of the PT chosen by Dr. Jacqualyn Posey, and they are requesting orders.02/09/2017-DrJacqualyn Posey ordered evaluation and treatment of achilles tendonitis/heel spur left. Orders faxed to [facility].

## 2016-12-29 ENCOUNTER — Encounter: Payer: Self-pay | Admitting: *Deleted

## 2017-01-11 ENCOUNTER — Ambulatory Visit (INDEPENDENT_AMBULATORY_CARE_PROVIDER_SITE_OTHER): Payer: Medicare Other

## 2017-01-11 ENCOUNTER — Encounter: Payer: Self-pay | Admitting: Podiatry

## 2017-01-11 ENCOUNTER — Ambulatory Visit (INDEPENDENT_AMBULATORY_CARE_PROVIDER_SITE_OTHER): Payer: Medicare Other | Admitting: Podiatry

## 2017-01-11 ENCOUNTER — Ambulatory Visit: Payer: Medicare Other | Admitting: Podiatry

## 2017-01-11 DIAGNOSIS — M722 Plantar fascial fibromatosis: Secondary | ICD-10-CM

## 2017-01-11 DIAGNOSIS — M779 Enthesopathy, unspecified: Secondary | ICD-10-CM

## 2017-01-11 DIAGNOSIS — G8929 Other chronic pain: Secondary | ICD-10-CM

## 2017-01-11 DIAGNOSIS — M79672 Pain in left foot: Principal | ICD-10-CM

## 2017-01-11 NOTE — Patient Instructions (Signed)

## 2017-01-11 NOTE — Progress Notes (Signed)
   Subjective:    Patient ID: Dalton Golden, male    DOB: 05-Apr-1929, 83 y.o.   MRN: [id number]  HPI  83 year old male presents the office they for concerns about 2 pain which is been ongoing for several years. He states he has been using some topical cream because over-the-counter which helps but he also has seen other physicians for this. He states that there is an area try to be drained previously but was unsuccessful. He states the areas painful issues. He denies any numbness or tingling. He denies any pain when he first gets up. Denies any recent injury or trauma. He had no other treatment. He points the posterior medial aspect of the heel where he gets the majority of his symptoms. Describes as a throbbing sensation at times and marked discomfort. He has no other complaints.   Review of Systems  All other systems reviewed and are negative.      Objective:   Physical Exam General: AAO x3, NAD  Dermatological: Skin is warm, dry and supple bilateral. Nails x 10 are well manicured; remaining integument appears unremarkable at this time. There are no open sores, no preulcerative lesions, no rash or signs of infection present.  Vascular: Dorsalis Pedis artery and Posterior Tibial artery pedal pulses are 2/4 bilateral with immedate capillary fill time. Pedal hair growth present.There is no pain with calf compression, swelling, warmth, erythema.   Neruologic: Grossly intact via light touch bilateral. Vibratory intact via tuning fork bilateral. Protective threshold with Semmes Wienstein monofilament intact to all pedal sites bilateral.   Musculoskeletal: Mild tenderness palpation the posterior medial aspect left heel. There is no films course of the Achilles tendon and Thompson's is negative. No pain with lateral compression of calcaneus and there is no significant edema, erythema, increase in warmth. There is no soft tissue mass present to the area. There is no erythema.  Muscular  strength 5/5 in all groups tested bilateral. Equinus is present.   Gait: Unassisted, Nonantalgic.      Assessment & Plan:  83 year old male left posterior heel pain -Treatment options discussed including all alternatives, risks, and complications -Etiology of symptoms were discussed -X-rays were obtained and reviewed with the patient. No significant bone spurs present how there is a bony areas overlong the area pain. There is no evidence of acute fracture. -Discussed stretching exercises. Dispensed night splint. -Offloading pads dispensed. -I would a compound cream for anti-inflammatory. This is ordered through [employer].  -Discussed shoe gear changes. -Follow-up in 4 weeks or sooner if any problems arise. In the meantime, encouraged to call the office with any questions, concerns, change in symptoms.   Celesta Gentile, DPm

## 2017-01-27 ENCOUNTER — Encounter: Payer: Self-pay | Admitting: Internal Medicine

## 2017-01-27 ENCOUNTER — Ambulatory Visit (INDEPENDENT_AMBULATORY_CARE_PROVIDER_SITE_OTHER): Payer: Medicare Other | Admitting: Internal Medicine

## 2017-01-27 VITALS — BP 114/70 | HR 59 | Temp 98.2°F | Ht 65.0 in | Wt 134.0 lb

## 2017-01-27 DIAGNOSIS — M7732 Calcaneal spur, left foot: Secondary | ICD-10-CM

## 2017-01-27 DIAGNOSIS — Z967 Presence of other bone and tendon implants: Secondary | ICD-10-CM | POA: Diagnosis not present

## 2017-01-27 DIAGNOSIS — R739 Hyperglycemia, unspecified: Secondary | ICD-10-CM

## 2017-01-27 DIAGNOSIS — E782 Mixed hyperlipidemia: Secondary | ICD-10-CM | POA: Diagnosis not present

## 2017-01-27 DIAGNOSIS — H6121 Impacted cerumen, right ear: Secondary | ICD-10-CM

## 2017-01-27 DIAGNOSIS — Z8781 Personal history of (healed) traumatic fracture: Secondary | ICD-10-CM

## 2017-01-27 DIAGNOSIS — Z9889 Other specified postprocedural states: Secondary | ICD-10-CM

## 2017-01-27 NOTE — Patient Instructions (Addendum)
Continue current medications as ordered  Right ear wash done today  Follow up in 1 yr for AWV/EV. Fasting labs prior to appt   Preventive Care 65 Years and Older, Male Preventive care refers to lifestyle choices and visits with your health care provider that can promote health and wellness. What does preventive care include?  A yearly physical exam. This is also called an annual well check.  Dental exams once or twice a year.  Routine eye exams. Ask your health care provider how often you should have your eyes checked.  Personal lifestyle choices, including:  Daily care of your teeth and gums.  Regular physical activity.  Eating a healthy diet.  Avoiding tobacco and drug use.  Limiting alcohol use.  Practicing safe sex.  Taking low doses of aspirin every day.  Taking vitamin and mineral supplements as recommended by your health care provider. What happens during an annual well check? The services and screenings done by your health care provider during your annual well check will depend on your age, overall health, lifestyle risk factors, and family history of disease. Counseling  Your health care provider may ask you questions about your:  Alcohol use.  Tobacco use.  Drug use.  Emotional well-being.  Home and relationship well-being.  Sexual activity.  Eating habits.  History of falls.  Memory and ability to understand (cognition).  Work and work Statistician. Screening  You may have the following tests or measurements:  Height, weight, and BMI.  Blood pressure.  Lipid and cholesterol levels. These may be checked every 5 years, or more frequently if you are over 90 years old.  Skin check.  Lung cancer screening. You may have this screening every year starting at age 49 if you have a 30-pack-year history of smoking and currently smoke or have quit within the past 15 years.  Fecal occult blood test (FOBT) of the stool. You may have this test every  year starting at age 34.  Flexible sigmoidoscopy or colonoscopy. You may have a sigmoidoscopy every 5 years or a colonoscopy every 10 years starting at age 52.  Prostate cancer screening. Recommendations will vary depending on your family history and other risks.  Hepatitis C blood test.  Hepatitis B blood test.  Sexually transmitted disease (STD) testing.  Diabetes screening. This is done by checking your blood sugar (glucose) after you have not eaten for a while (fasting). You may have this done every 1-3 years.  Abdominal aortic aneurysm (AAA) screening. You may need this if you are a current or former smoker.  Osteoporosis. You may be screened starting at age 27 if you are at high risk. Talk with your health care provider about your test results, treatment options, and if necessary, the need for more tests. Vaccines  Your health care provider may recommend certain vaccines, such as:  Influenza vaccine. This is recommended every year.  Tetanus, diphtheria, and acellular pertussis (Tdap, Td) vaccine. You may need a Td booster every 10 years.  Varicella vaccine. You may need this if you have not been vaccinated.  Zoster vaccine. You may need this after age 31.  Measles, mumps, and rubella (MMR) vaccine. You may need at least one dose of MMR if you were born in 1957 or later. You may also need a second dose.  Pneumococcal 13-valent conjugate (PCV13) vaccine. One dose is recommended after age 82.  Pneumococcal polysaccharide (PPSV23) vaccine. One dose is recommended after age 32.  Meningococcal vaccine. You may need this if  you have certain conditions.  Hepatitis A vaccine. You may need this if you have certain conditions or if you travel or work in places where you may be exposed to hepatitis A.  Hepatitis B vaccine. You may need this if you have certain conditions or if you travel or work in places where you may be exposed to hepatitis B.  Haemophilus influenzae type b (Hib)  vaccine. You may need this if you have certain risk factors. Talk to your health care provider about which screenings and vaccines you need and how often you need them. This information is not intended to replace advice given to you by your health care provider. Make sure you discuss any questions you have with your health care provider. Document Released: 09/13/2015 Document Revised: 05/06/2016 Document Reviewed: 06/18/2015 Elsevier Interactive Patient Education  2017 [employer].

## 2017-01-27 NOTE — Progress Notes (Signed)
Patient ID: Dalton Golden, male   DOB: [birth year], 84 y.o.MRN: [id number]   Location:  [facility]  Place of Service:  OFFICE  Provider: Arletha Grippe, DO  Patient Care Team: Gildardo Cranker, DO as PCP - General (Internal Medicine)  Extended Emergency Contact Information Primary Emergency Contact: [name],[name] Address: [address]          [name], [name] [address] [name] Phone: [phone] Mobile Phone: [phone] Relation: Son  Code Status:  Goals of Care: Advanced Directive information Advanced Directives 01/27/2017  Does Patient Have a Medical Advance Directive? Yes  Type of Advance Directive [place]  Does patient want to make changes to medical advance directive? No - Patient declined  Copy of [place] in Chart? -  Would patient like information on creating a medical advance directive? -     Chief Complaint  Patient presents with  . Medical Management of Chronic Issues    Extended visit (rescheduled from April 2018). AWV completed 12/22/16. MMSE 28/30. Refused EKG, patient states " My heart is fine."  . Medication Refill    No refills needed today   . Immunizations    Refused all immunizations     HPI: Patient is a 83 y.o. male seen in today for an extended visit.  AWV NOTE FROM 12/22/16 REVIEWED. CBC w diff, TSH and lipid panel in nml limits. Serum glucose 118.  Hx right hip fx 2/2 mechanical fall s/p right hip ORIF 07/26/16 followed by several weeks of rehab in [place], [place]. He completed o/p PT and is doing well. He can even get on his floor but he still has difficulty getting off floor.  Hyperlipidemia - takes lipitor. No myalgias. LDL 71  He takes ASA and MVI daily  He declines all vaccines and ECG today  He is retired from [employer]  Depression screen [facility] 2/9 01/27/2017 12/22/2016 06/24/2016  Decreased Interest 0 0 0  Down, Depressed, Hopeless 0 0 0  PHQ - 2 Score 0 0 0      Fall Risk  01/27/2017 12/22/2016 10/07/2016 06/24/2016  Falls in the past year? Yes Yes Yes No  Number falls in past yr: 1 1 1  -  Injury with Fall? No Yes Yes -   MMSE - Mini Mental State Exam 12/22/2016  Orientation to time 5  Orientation to Place 5  Registration 3  Attention/ Calculation 5  Recall 1  Language- name 2 objects 2  Language- repeat 1  Language- follow 3 step command 3  Language- read & follow direction 1  Write a sentence 1  Copy design 1  Total score 28     Health Maintenance  Topic Date Due  . TETANUS/TDAP  12/23/2026 (Originally 11/09/1947)  . INFLUENZA VACCINE  03/31/2017  . PNA vac Low Risk Adult (2 of 2 - PPSV23) 06/18/2017     Past Medical History:  Diagnosis Date  . Hyperlipidemia     Past Surgical History:  Procedure Laterality Date  . HIP SURGERY Right 07/26/2016   [name]    Family History  Problem Relation Age of Onset  . Heart attack Brother   . Heart disease Brother    Family Status  Relation Status  . Mother Deceased  . Father Deceased  . Brother Alive  . Brother Deceased  . Daughter Alive  . Son Alive     Social History   Social History  . Marital status: Widowed  Spouse name: N/A  . Number of children: N/A  . Years of education: N/A   Occupational History  . Not on file.   Social History Main Topics  . Smoking status: Former Smoker    Packs/day: 1.00    Years: 25.00    Types: Cigarettes    Quit date: 08/31/1958  . Smokeless tobacco: Never Used  . Alcohol use Yes     Comment: 1 drink every night  . Drug use: No  . Sexual activity: Not on file   Other Topics Concern  . Not on file   Social History Narrative   Diet: NA   Caffeine:1 to 2 cups per day   Married:Widowed, married in [place]   House:Yes, one stories, one person   Pets:no   Current/Past profession:Business Owner   Exercise:Yes, walking   Living Will: Yes   DNR:Yes   POA/HPOA:Yes       No Known Allergies  Allergies as of  01/27/2017   No Known Allergies     Medication List       Accurate as of 01/27/17  9:14 AM. Always use your most recent med list.          aspirin 81 MG EC tablet Take 81 mg by mouth daily. Swallow whole.   atorvastatin 40 MG tablet Commonly known as:  LIPITOR Take 20 mg by mouth daily. High Cholesterol   NONFORMULARY OR COMPOUNDED ITEM [facility]  Achilles Tendonitis Cream- Diclofenac 3%, Baclofen 2%, Bupivacaine 1%, Doxepin 5%, Gabapentin 6%, Ibuprofen 3%, Pentoxifylline 3% Apply 1-2 grams to affected area 3-4 times daily Qty. 120 gm 3 refills   ONE-A-DAY MENS PO Take by mouth daily.   STOOL SOFTENER PO Take 1 tablet by mouth daily.   tamsulosin 0.4 MG Caps capsule Commonly known as:  FLOMAX Take 0.4 mg by mouth daily.        Review of Systems:  Review of Systems  Musculoskeletal: Positive for arthralgias and gait problem.  All other systems reviewed and are negative.   Physical Exam: Vitals:   01/27/17 0857  BP: 114/70  Pulse: (!) 59  Temp: 98.2 F (36.8 C)  TempSrc: Oral  SpO2: 97%  Weight: 134 lb (60.8 kg)  Height: 5\' 5"  (1.651 m)   Body mass index is 22.3 kg/m. Physical Exam  Constitutional: He is oriented to person, place, and time. He appears well-developed and well-nourished. No distress.  HENT:  Head: Normocephalic and atraumatic.  Right Ear: Hearing, tympanic membrane, external ear and ear canal normal.  Left Ear: Hearing, tympanic membrane, external ear and ear canal normal.  Mouth/Throat: Uvula is midline, oropharynx is clear and moist and mucous membranes are normal. He does not have dentures.  Eyes: Conjunctivae, EOM and lids are normal. Pupils are equal, round, and reactive to light. No scleral icterus.  Neck: Trachea normal and normal range of motion. Neck supple. Carotid bruit is not present. No thyroid mass and no thyromegaly present.  Cardiovascular: Normal rate, regular rhythm, normal heart sounds and intact distal pulses.   Exam reveals no gallop and no friction rub.   No murmur heard. no distal LE swelling. No calf TTP  Pulmonary/Chest: Effort normal and breath sounds normal. He has no wheezes. He has no rhonchi. He has no rales. He exhibits no tenderness. Right breast exhibits no inverted nipple, no mass, no nipple discharge, no skin change and no tenderness. Left breast exhibits no inverted nipple, no mass, no nipple discharge, no skin change and no tenderness.  Breasts are symmetrical.  Abdominal: Soft. Normal appearance, normal aorta and bowel sounds are normal. He exhibits no distension, no abdominal bruit, no pulsatile midline mass and no mass. There is no hepatosplenomegaly or hepatomegaly. There is no tenderness. There is no rigidity, no rebound and no guarding. No hernia.  Genitourinary:  Genitourinary Comments: deferred due to age  Musculoskeletal: Normal range of motion. He exhibits edema.  Right proximal hip incisional scar, NT and no wound dehiscence.  Lymphadenopathy:       Head (right side): No posterior auricular adenopathy present.       Head (left side): No posterior auricular adenopathy present.    He has no cervical adenopathy.       Right: No supraclavicular adenopathy present.       Left: No supraclavicular adenopathy present.  Neurological: He is alert and oriented to person, place, and time. He has normal strength and normal reflexes. No cranial nerve deficit. Gait abnormal.  Skin: Skin is warm, dry and intact. No rash noted. Nails show no clubbing.  Psychiatric: He has a normal mood and affect. His speech is normal and behavior is normal. Judgment and thought content normal. Cognition and memory are normal.    Labs reviewed:  Basic Metabolic Panel:  Recent Labs  12/22/16 0815  NA 142  K 4.5  CL 104  CO2 31  GLUCOSE 118*  BUN 23  CREATININE 0.85  CALCIUM 9.1  TSH 2.95   Liver Function Tests:  Recent Labs  12/22/16 0815  AST 18  ALT 18  ALKPHOS 60  BILITOT 0.6  PROT  6.6  ALBUMIN 4.2   No results for input(s): LIPASE, AMYLASE in the last 8760 hours. No results for input(s): AMMONIA in the last 8760 hours. CBC:  Recent Labs  08/05/16 12/22/16 0815  WBC 10.3 7.5  NEUTROABS  --  4,500  HGB 11.4* 14.6  HCT 34* 43.2  MCV  --  90.6  PLT 418* 237   Lipid Panel:  Recent Labs  12/22/16 0815  CHOL 156  HDL 72  LDLCALC 71  TRIG 67  CHOLHDL 2.2   No results found for: HGBA1C  Procedures: Dg Foot Complete Left  Result Date: 01/13/2017 Please see detailed radiograph report in office note.   Assessment/Plan   ICD-9-CM ICD-10-CM   1. Impacted cerumen of right ear 380.4 H61.21 Ear Lavage  2. Hyperglycemia 790.29 R73.9   3. Mixed hyperlipidemia 272.2 E78.2   4. Calcaneal spur of left foot 726.73 M77.32   5. S/P ORIF (open reduction internal fixation) fracture V45.89 Z96.7    V15.51 Z87.81     Continue current medications as ordered  Right ear wash done today  Follow up in 1 yr for AWV/EV. Fasting labs prior to appt  Handout "preventive care 65 yrs and older, male" given today  [place] S. Perlie Gold  [facility] [address] [place], [place] [address] [phone] Cell (Monday-Friday 8 AM - 5 PM) [phone] After 5 PM and follow prompts

## 2017-02-08 ENCOUNTER — Encounter: Payer: Self-pay | Admitting: Podiatry

## 2017-02-08 ENCOUNTER — Ambulatory Visit (INDEPENDENT_AMBULATORY_CARE_PROVIDER_SITE_OTHER): Payer: Medicare Other | Admitting: Podiatry

## 2017-02-08 DIAGNOSIS — M79672 Pain in left foot: Secondary | ICD-10-CM

## 2017-02-08 DIAGNOSIS — G8929 Other chronic pain: Secondary | ICD-10-CM | POA: Diagnosis not present

## 2017-02-09 NOTE — Telephone Encounter (Signed)
Ok that is fine. It is for achilles tendonitis/heel spur, eval and treat. Thanks.

## 2017-02-10 NOTE — Progress Notes (Signed)
Subjective: 83 year old male presents the office today for follow up evaluation of posterior left heel pain. He states that the night splint, the brace as well as the padding does not help. He states he did not try for long. He is returning the night splint today. He states that the creams also $35 and does not get this. He states this pain has been there for 20 years total. He states that steroid injections which did not help as well as other treatment. He denies any numbness or tingling. Denies any systemic complaints such as fevers, chills, nausea, vomiting. No acute changes since last appointment, and no other complaints at this time.   Objective: AAO x3, NAD DP/PT pulses palpable bilaterally, CRT less than 3 seconds There is continuation tenderness palpation along the posterior aspect of the calcaneus along the insertion of the Achilles tendon. There is no pain lateral compression of calcaneus. Thompson test is negative. There is no overlying edema, erythema, increase in warmth.o open lesions or pre-ulcerative lesions.  No pain with calf compression, swelling, warmth, erythema  Assessment: Chronic posterior heel pain  Plan: -All treatment options discussed with the patient including all alternatives, risks, complications.  -At this point as long discussion regards to multiple treatment options for this. This has been ongoing for 20 years. All treatment options are discussed with him he did not want to proceed with. It was then he would agree to was either laser therapy which we do not do this office or physical therapy. We will start physical therapy to see if this will help. -He also at this appointment he kept talking of the survey she gets from the offices the surgery he'll be giving me.  -Patient encouraged to call the office with any questions, concerns, change in symptoms.   Celesta Gentile, DPM

## 2017-02-12 ENCOUNTER — Ambulatory Visit: Payer: Medicare Other | Attending: Internal Medicine | Admitting: Physical Therapy

## 2017-02-12 ENCOUNTER — Encounter: Payer: Self-pay | Admitting: Physical Therapy

## 2017-02-12 DIAGNOSIS — M25572 Pain in left ankle and joints of left foot: Secondary | ICD-10-CM | POA: Diagnosis not present

## 2017-02-12 DIAGNOSIS — M25651 Stiffness of right hip, not elsewhere classified: Secondary | ICD-10-CM | POA: Diagnosis not present

## 2017-02-12 DIAGNOSIS — R262 Difficulty in walking, not elsewhere classified: Secondary | ICD-10-CM | POA: Insufficient documentation

## 2017-02-12 DIAGNOSIS — M6281 Muscle weakness (generalized): Secondary | ICD-10-CM | POA: Diagnosis not present

## 2017-02-14 NOTE — Therapy (Signed)
[place], [place], [address] Phone: [phone]   Fax:  [phone]  Physical Therapy Evaluation  Patient Details  Name: Dalton Golden MRN: [id number] Date of Birth: [birth year] Referring Provider: Trula Slade, DPM  Encounter Date: 02/12/2017    Past Medical History:  Diagnosis Date  . Hyperlipidemia     Past Surgical History:  Procedure Laterality Date  . HIP SURGERY Right 07/26/2016   [facility]    There were no vitals filed for this visit.  Subjective:   Pt reports heel hurts when he hits it on the side of the bed. Wearing a shoe is uncomfortable. reports getting his exercise by keeping his house up    Objective:   Left ankle dorsiflexion 4 deg Right ankle dorsiflexion 8 deg     Objective measurements completed on examination: See above findings.                    PT Short Term Goals - 02/14/17 1826      PT SHORT TERM GOAL #1   Title goal from past episode     PT SHORT TERM GOAL #2   Title goal from past episode     PT SHORT TERM GOAL #3   Title goal from past episode     PT SHORT TERM GOAL #4   Title goal from past episode           PT Long Term Goals - 02/12/17 1606      PT LONG TERM GOAL #1   Title Passive L ankle DF to 8 deg to decrease excessive pull at distal achilled insertion by 7/6   Baseline see flowsheet   Time 3   Period Weeks   Status New     PT LONG TERM GOAL #2   Title Pt will be independent in HEP to continue stretching and strengthening ankle for appropriate support to joint   Baseline began establishing at eval   Time 3   Period Weeks   Status New     PT LONG TERM GOAL #3   Title Pt will verbalize 50% improvement in wearing shoes to demo decreased irritation by spur in heel   Baseline reports it hurts to wear shoes at eval   Time 3   Period Weeks   Status New                Plan -  02/14/17 1814    Clinical Impression Statement pt presents to PT with diagnosis of heel spur and achilles tendonitis. Pt has tried multiple treatments but continues to have difficulty wearing shoes. Discussed anatomy of spur and importance of achilles flexiblity. Pt will benefit from skilled PT to meet functional goals and create HEP for long term management of heel spur.    History and Personal Factors relevant to plan of care: h/o femoral neck fracture   Clinical Presentation Stable   Clinical Presentation due to: n/a   Clinical Decision Making Low   PT Frequency 1x / week   PT Duration 3 weeks   PT Treatment/Interventions ADLs/Self Care Home Management;Electrical Stimulation;Iontophoresis 4mg /ml Dexamethasone;Functional mobility training;Stair training;Gait training;Ultrasound;Moist Heat;Therapeutic activities;Therapeutic exercise;Balance training;Neuromuscular re-education;Patient/family education;Passive range of motion;Manual techniques;Dry needling;Taping   PT Next Visit Plan gastroc/soleus stretching, any change with ionto?  ankle 4 way   PT Home Exercise Plan gastroc stretch with towel, plantar fascia stretch;    Consulted and Agree with Plan of Care Patient  Patient will benefit from skilled therapeutic intervention in order to improve the following deficits and impairments:  Pain, Impaired flexibility, Decreased activity tolerance  Visit Diagnosis: Pain in left ankle and joints of left foot - Plan: PT plan of care cert/re-cert      G-Codes - 11/88/67 1821    Functional Assessment Tool Used (Outpatient Only) dorsiflexion ROM, clinical judgement   Functional Limitation Other PT primary   Other PT Primary Current Status (R3736) At least 1 percent but less than 20 percent impaired, limited or restricted   Other PT Primary Goal Status (K8159) 0 percent impaired, limited or restricted       Problem List Patient Active Problem List   Diagnosis Date Noted  . Overgrown  toenails 12/10/2016  . Closed displaced fracture of right femoral neck ([place]) 10/13/2016  . Mixed hyperlipidemia 06/24/2016  . Heel spur, unspecified laterality 06/24/2016   Marcille Barman C. Janeane Cozart PT, DPT 02/14/17 6:35 PM    [facility] [address] [place], [place], [address] Phone: [phone]   Fax:  [phone]  Name: Dalton Golden MRN: [id number] Date of Birth: Jul 31, 1929

## 2017-02-15 ENCOUNTER — Ambulatory Visit: Payer: Medicare Other | Admitting: Physical Therapy

## 2017-02-15 ENCOUNTER — Encounter: Payer: Self-pay | Admitting: Physical Therapy

## 2017-02-15 DIAGNOSIS — M25572 Pain in left ankle and joints of left foot: Secondary | ICD-10-CM

## 2017-02-15 DIAGNOSIS — R262 Difficulty in walking, not elsewhere classified: Secondary | ICD-10-CM | POA: Diagnosis not present

## 2017-02-15 DIAGNOSIS — M6281 Muscle weakness (generalized): Secondary | ICD-10-CM | POA: Diagnosis not present

## 2017-02-15 DIAGNOSIS — M25651 Stiffness of right hip, not elsewhere classified: Secondary | ICD-10-CM

## 2017-02-15 NOTE — Therapy (Signed)
[place], [place], [address] Phone: [phone]   Fax:  [phone]  Physical Therapy Treatment  Patient Details  Name: Dalton Golden MRN: [id number] Date of Birth: 03/10/1929 Referring Provider: Trula Slade, DPM  Encounter Date: 02/15/2017      PT End of Session - 02/15/17 1111    Visit Number 2   Number of Visits 4   Date for PT Re-Evaluation 12/17/16   Authorization Type [facility] TRAD- KX beg visit 1   PT Start Time 1038   PT Stop Time 1105   PT Time Calculation (min) 27 min   Activity Tolerance Patient tolerated treatment well   Behavior During Therapy [facility] for tasks assessed/performed      Past Medical History:  Diagnosis Date  . Hyperlipidemia     Past Surgical History:  Procedure Laterality Date  . HIP SURGERY Right 07/26/2016   [facility]    There were no vitals filed for this visit.      Subjective Assessment - 02/15/17 1314    Subjective Patien reports no real change. He only has pain when he stands for too long. He doesnt have any pain right now.    Limitations Standing;House hold activities   How long can you sit comfortably? No limit   How long can you stand comfortably? No limit    How long can you walk comfortably? decreased community ambualtion from baseline    Diagnostic tests Nothing recently    Patient Stated Goals To put his shoes on    Currently in Pain? No/denies                         [facility] Adult PT Treatment/Exercise - 02/15/17 0001      Knee/Hip Exercises: Stretches   Other Knee/Hip Stretches plantar fascia stretch     Iontophoresis   Type of Iontophoresis Dexamethasone   Location L achilles distal insertion   Dose 1cc   Time 6 hr wear time     Manual Therapy   Passive ROM soft tissue mobilization to achillies and gastroc area; Manual gastroc stretching; TFM to achillies ( patien reported pain)                 PT Education - 02/15/17 1144    Education provided Yes   Education Details importance of stretching and strengthening    Person(s) Educated Patient   Methods Explanation;Demonstration;Tactile cues;Verbal cues   Comprehension Verbalized understanding;Returned demonstration;Verbal cues required;Tactile cues required          PT Short Term Goals - 02/14/17 1826      PT SHORT TERM GOAL #1   Title goal from past episode     PT SHORT TERM GOAL #2   Title goal from past episode     PT SHORT TERM GOAL #3   Title goal from past episode     PT SHORT TERM GOAL #4   Title goal from past episode           PT Long Term Goals - 02/15/17 1148      PT LONG TERM GOAL #1   Title Passive L ankle DF to 8 deg to decrease excessive pull at distal achilled insertion by 7/6   Baseline no change    Time 3   Period Weeks   Status On-going     PT LONG TERM GOAL #2   Title Pt will be independent in HEP  to continue stretching and strengthening ankle for appropriate support to joint   Baseline began establishing at eval   Time 3   Period Weeks   Status On-going     PT LONG TERM GOAL #3   Title Pt will verbalize 50% improvement in wearing shoes to demo decreased irritation by spur in heel   Baseline reports it hurts to wear shoes at eval   Time 3   Period Weeks   Status On-going               Plan - 02/15/17 1112    Clinical Impression Statement Patient reports he does not see too much of a difference. 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 He reports he can not find the right towel for his stretch. He was advised he can use any towel Patient had some pain with soft tissue mobilization around the achillies insertion. He required mod cuing for    Clinical Presentation Stable   Clinical Decision Making Low   PT Frequency 2x / week   PT Duration 8 weeks   PT Treatment/Interventions ADLs/Self Care Home Management;Electrical  Stimulation;Iontophoresis 4mg /ml Dexamethasone;Functional mobility training;Stair training;Gait training;Ultrasound;Moist Heat;Therapeutic activities;Therapeutic exercise;Balance training;Neuromuscular re-education;Patient/family education;Passive range of motion;Manual techniques;Dry needling;Taping   PT Next Visit Plan gastroc/soleus stretching, any change with ionto?  ankle 4 way   PT Home Exercise Plan gastroc stretch with towel, plantar fascia stretch;    Consulted and Agree with Plan of Care Patient      Patient will benefit from skilled therapeutic intervention in order to improve the following deficits and impairments:  Pain, Impaired flexibility, Decreased activity tolerance  Visit Diagnosis: Pain in left ankle and joints of left foot  Stiffness of right hip, not elsewhere classified  Muscle weakness (generalized)  Difficulty in walking, not elsewhere classified       G-Codes - February 15, 2017 1821    Functional Assessment Tool Used (Outpatient Only) dorsiflexion ROM, clinical judgement   Functional Limitation Other PT primary   Other PT Primary Current Status (M0867) At least 1 percent but less than 20 percent impaired, limited or restricted   Other PT Primary Goal Status (Y1950) 0 percent impaired, limited or restricted      Problem List Patient Active Problem List   Diagnosis Date Noted  . Overgrown toenails 12/10/2016  . Closed displaced fracture of right femoral neck ([place]) 10/13/2016  . Mixed hyperlipidemia 06/24/2016  . Heel spur, unspecified laterality 06/24/2016    Carney Living PT DPT  02/15/2017, 1:22 PM  [facility] [address] [place], [place], [address] Phone: [phone]   Fax:  [phone]  Name: Jeri Rawlins MRN: [id number] Date of Birth: 09-20-1928

## 2017-02-18 ENCOUNTER — Ambulatory Visit: Payer: Medicare Other | Admitting: Physical Therapy

## 2017-02-23 ENCOUNTER — Encounter: Payer: Self-pay | Admitting: Physical Therapy

## 2017-02-23 ENCOUNTER — Ambulatory Visit: Payer: Medicare Other | Admitting: Physical Therapy

## 2017-02-23 DIAGNOSIS — M6281 Muscle weakness (generalized): Secondary | ICD-10-CM

## 2017-02-23 DIAGNOSIS — R262 Difficulty in walking, not elsewhere classified: Secondary | ICD-10-CM

## 2017-02-23 DIAGNOSIS — M25572 Pain in left ankle and joints of left foot: Secondary | ICD-10-CM | POA: Diagnosis not present

## 2017-02-23 DIAGNOSIS — M25651 Stiffness of right hip, not elsewhere classified: Secondary | ICD-10-CM | POA: Diagnosis not present

## 2017-02-23 NOTE — Therapy (Signed)
[place], [place], [address] Phone: [phone]   Fax:  [phone]  Physical Therapy Treatment  Patient Details  Name: Dalton Golden MRN: [id number] Date of Birth: [birth year] Referring Provider: Trula Slade, DPM  Encounter Date: 02/23/2017      PT End of Session - 02/23/17 1015    Visit Number 3   Number of Visits 4   Date for PT Re-Evaluation 12/17/16   Authorization Type [employer] TRAD- KX beg visit 1   PT Start Time 1015   PT Stop Time 1056   PT Time Calculation (min) 41 min   Activity Tolerance Patient tolerated treatment well   Behavior During Therapy [facility] for tasks assessed/performed      Past Medical History:  Diagnosis Date  . Hyperlipidemia     Past Surgical History:  Procedure Laterality Date  . HIP SURGERY Right 07/26/2016   [facility]    There were no vitals filed for this visit.      Subjective Assessment - 02/23/17 1016    Subjective Not feeling a difference, is doing stretches at home. Hit his heel on the edge of his bed today which was painful.                         [place] Adult PT Treatment/Exercise - 02/23/17 0001      Knee/Hip Exercises: Stretches   Passive Hamstring Stretch Both;2 reps;30 seconds   Gastroc Stretch 30 seconds   Gastroc Stretch Limitations slant board   Soleus Stretch 30 seconds   Soleus Stretch Limitations slant board     Knee/Hip Exercises: Aerobic   Nustep 5 min L4     Knee/Hip Exercises: Supine   Other Supine Knee/Hip Exercises ankle red tband- inversion, eversion, DF     Manual Therapy   Manual Therapy Soft tissue mobilization   Soft tissue mobilization IASTM L gastroc/soleus, hamstrings, heel                PT Education - 02/23/17 1100    Education provided Yes   Education Details importance of HEP, balance of exercise and stretching   Person(s) Educated Patient   Methods  Explanation;Demonstration;Tactile cues;Verbal cues;Handout   Comprehension Verbalized understanding;Returned demonstration;Verbal cues required;Tactile cues required;Need further instruction          PT Short Term Goals - 02/14/17 1826      PT SHORT TERM GOAL #1   Title goal from past episode     PT SHORT TERM GOAL #2   Title goal from past episode     PT SHORT TERM GOAL #3   Title goal from past episode     PT SHORT TERM GOAL #4   Title goal from past episode           PT Long Term Goals - 02/15/17 1148      PT LONG TERM GOAL #1   Title Passive L ankle DF to 8 deg to decrease excessive pull at distal achilled insertion by 7/6   Baseline no change    Time 3   Period Weeks   Status On-going     PT LONG TERM GOAL #2   Title Pt will be independent in HEP to continue stretching and strengthening ankle for appropriate support to joint   Baseline began establishing at eval   Time 3   Period Weeks   Status On-going     PT LONG TERM GOAL #  3   Title Pt will verbalize 50% improvement in wearing shoes to demo decreased irritation by spur in heel   Baseline reports it hurts to wear shoes at eval   Time 3   Period Weeks   Status On-going               Plan - 02/23/17 1100    Clinical Impression Statement Pt denied ionto today due to lack of effects. Reported "I can feel that" with IASTM to calf musculature. Limited hamstring length on L v R.    PT Treatment/Interventions ADLs/Self Care Home Management;Electrical Stimulation;Iontophoresis 4mg /ml Dexamethasone;Functional mobility training;Stair training;Gait training;Ultrasound;Moist Heat;Therapeutic activities;Therapeutic exercise;Balance training;Neuromuscular re-education;Patient/family education;Passive range of motion;Manual techniques;Dry needling;Taping   PT Next Visit Plan review HEP, D/C   PT Home Exercise Plan gastroc stretch with towel, plantar fascia stretch; supine hamstring stretch   Consulted and Agree  with Plan of Care Patient      Patient will benefit from skilled therapeutic intervention in order to improve the following deficits and impairments:  Pain, Impaired flexibility, Decreased activity tolerance  Visit Diagnosis: Pain in left ankle and joints of left foot  Stiffness of right hip, not elsewhere classified  Muscle weakness (generalized)  Difficulty in walking, not elsewhere classified     Problem List Patient Active Problem List   Diagnosis Date Noted  . Overgrown toenails 12/10/2016  . Closed displaced fracture of right femoral neck ([place]) 10/13/2016  . Mixed hyperlipidemia 06/24/2016  . Heel spur, unspecified laterality 06/24/2016    Sarh Kirschenbaum C. Jillianne Gamino PT, DPT 02/23/17 11:02 AM   [facility] [address] [place], [place], [address] Phone: [phone]   Fax:  [phone]  Name: Baruch Lewers MRN: [id number] Date of Birth: [birth year]

## 2017-02-25 ENCOUNTER — Encounter: Payer: Medicare Other | Admitting: Physical Therapy

## 2017-03-01 ENCOUNTER — Encounter: Payer: Self-pay | Admitting: Physical Therapy

## 2017-03-01 ENCOUNTER — Ambulatory Visit: Payer: Medicare Other | Attending: Internal Medicine | Admitting: Physical Therapy

## 2017-03-01 DIAGNOSIS — M25651 Stiffness of right hip, not elsewhere classified: Secondary | ICD-10-CM | POA: Insufficient documentation

## 2017-03-01 DIAGNOSIS — R262 Difficulty in walking, not elsewhere classified: Secondary | ICD-10-CM | POA: Diagnosis not present

## 2017-03-01 DIAGNOSIS — M25572 Pain in left ankle and joints of left foot: Secondary | ICD-10-CM | POA: Insufficient documentation

## 2017-03-01 DIAGNOSIS — M6281 Muscle weakness (generalized): Secondary | ICD-10-CM | POA: Insufficient documentation

## 2017-03-01 NOTE — Therapy (Signed)
[place], [place], [address] Phone: [phone]   Fax:  [phone]  Physical Therapy Treatment/ Discharge  Patient Details  Name: Dalton Golden MRN: [id number] Date of Birth: 07-09-1929 Referring Provider: Trula Slade, DPM  Encounter Date: 03/01/2017      PT End of Session - 03/01/17 1059    Visit Number 4   Number of Visits 4   Date for PT Re-Evaluation 12/17/16   Authorization Type [employer] TRAD- KX beg visit 1   PT Start Time 1017   PT Stop Time 1100   PT Time Calculation (min) 43 min   Activity Tolerance Patient tolerated treatment well   Behavior During Therapy [facility] for tasks assessed/performed      Past Medical History:  Diagnosis Date  . Hyperlipidemia     Past Surgical History:  Procedure Laterality Date  . HIP SURGERY Right 07/26/2016   [facility]    There were no vitals filed for this visit.      Subjective Assessment - 03/01/17 1056    Subjective Patient reports not much difference. He reports he has been doing his stretches with his t-band. He was advised to use something that does not give so his muscle can stretch.    Limitations Standing;House hold activities   How long can you sit comfortably? No limit   How long can you stand comfortably? No limit    How long can you walk comfortably? decreased community ambualtion from baseline    Diagnostic tests Nothing recently    Currently in Pain? No/denies                                 PT Education - 03/01/17 1309    Education provided Yes   Education Details Continue with HEP; reviewed HEP    Person(s) Educated Patient   Methods Explanation;Demonstration;Tactile cues;Verbal cues   Comprehension Verbalized understanding;Returned demonstration;Verbal cues required;Tactile cues required;Need further instruction          PT Short Term Goals - 03/01/17 1313      PT SHORT TERM  GOAL #1   Title goal from past episode   Baseline 110 degrees    Time 4   Period Weeks   Status Achieved     PT SHORT TERM GOAL #2   Title goal from past episode   Baseline 45 degrees    Time 4   Period Weeks   Status Achieved     PT SHORT TERM GOAL #3   Period Weeks   Status Achieved     PT SHORT TERM GOAL #4   Baseline Patient knows his exercises well            PT Long Term Goals - 03/01/17 1314      PT LONG TERM GOAL #1   Title Passive L ankle DF to 8 deg to decrease excessive pull at distal achilled insertion by 7/6   Baseline no change    Time 3   Period Weeks   Status On-going     PT LONG TERM GOAL #2   Title Pt will be independent in HEP to continue stretching and strengthening ankle for appropriate support to joint   Baseline began establishing at eval   Time 3   Period Weeks   Status On-going     PT LONG TERM GOAL #3   Title Pt will verbalize 50% improvement  in wearing shoes to demo decreased irritation by spur in heel   Baseline reports it hurts to wear shoes at eval   Time 3   Period Weeks   Status On-going               Plan - Mar 31, 2017 1310    Clinical Impression Statement Patient is independent with his HEP. He is hesitant to allow therapy to work on the actual painful area. He was encouraged to continue with HEP.    Clinical Presentation Stable   Clinical Decision Making Low   Rehab Potential Good   PT Frequency 2x / week   PT Duration 8 weeks   PT Treatment/Interventions ADLs/Self Care Home Management;Electrical Stimulation;Iontophoresis 17m/ml Dexamethasone;Functional mobility training;Stair training;Gait training;Ultrasound;Moist Heat;Therapeutic activities;Therapeutic exercise;Balance training;Neuromuscular re-education;Patient/family education;Passive range of motion;Manual techniques;Dry needling;Taping   PT Next Visit Plan D/C    PT Home Exercise Plan gastroc stretch with towel, plantar fascia stretch; supine hamstring stretch    Consulted and Agree with Plan of Care Patient      Patient will benefit from skilled therapeutic intervention in order to improve the following deficits and impairments:  Abnormal gait  Visit Diagnosis: Pain in left ankle and joints of left foot  Stiffness of right hip, not elsewhere classified  Muscle weakness (generalized)  Difficulty in walking, not elsewhere classified       G-Codes - 008/01/181316    Functional Assessment Tool Used (Outpatient Only) dorsiflexion ROM, clinical judgement   Other PT Primary Current Status ((R7408 At least 1 percent but less than 20 percent impaired, limited or restricted   Other PT Primary Goal Status ((X4481 0 percent impaired, limited or restricted   Other PT Primary Discharge Status ((E5631 At least 1 percent but less than 20 percent impaired, limited or restricted     PHYSICAL THERAPY DISCHARGE SUMMARY  Visits from Start of Care: 4  Current functional level related to goals / functional outcomes: Continue with HEP    Remaining deficits: Continued heel pain   Education / Equipment: HEP  Plan: Patient agrees to discharge.  Patient goals were met. Patient is being discharged due to meeting the stated rehab goals.  ?????      Problem List Patient Active Problem List   Diagnosis Date Noted  . Overgrown toenails 12/10/2016  . Closed displaced fracture of right femoral neck ([place] 10/13/2016  . Mixed hyperlipidemia 06/24/2016  . Heel spur, unspecified laterality 06/24/2016    DCarney Living708/08/2016 1:21 PM  [facility] [address]Phone: [phone]  Fax:  [phone] Name: GFaustino LueckeMRN: [id number] of Birth: [birth year]

## 2017-03-04 ENCOUNTER — Encounter: Payer: Medicare Other | Admitting: Physical Therapy

## 2017-03-08 ENCOUNTER — Encounter: Payer: Medicare Other | Admitting: Physical Therapy

## 2017-03-10 ENCOUNTER — Encounter: Payer: Medicare Other | Admitting: Physical Therapy

## 2017-03-22 ENCOUNTER — Other Ambulatory Visit: Payer: Self-pay | Admitting: *Deleted

## 2017-03-22 MED ORDER — ATORVASTATIN CALCIUM 40 MG PO TABS: 20.0000 mg | ORAL_TABLET | Freq: Every day | ORAL | 3 refills | Status: DC

## 2017-03-22 NOTE — Telephone Encounter (Signed)
Patient requested refill to [facility].

## 2017-03-29 ENCOUNTER — Encounter (INDEPENDENT_AMBULATORY_CARE_PROVIDER_SITE_OTHER): Payer: Self-pay | Admitting: Physician Assistant

## 2017-03-29 ENCOUNTER — Ambulatory Visit (INDEPENDENT_AMBULATORY_CARE_PROVIDER_SITE_OTHER): Payer: Medicare Other

## 2017-03-29 ENCOUNTER — Telehealth: Payer: Self-pay | Admitting: Internal Medicine

## 2017-03-29 ENCOUNTER — Encounter: Payer: Self-pay | Admitting: Internal Medicine

## 2017-03-29 ENCOUNTER — Ambulatory Visit (INDEPENDENT_AMBULATORY_CARE_PROVIDER_SITE_OTHER): Payer: Medicare Other | Admitting: Physician Assistant

## 2017-03-29 DIAGNOSIS — M7662 Achilles tendinitis, left leg: Secondary | ICD-10-CM

## 2017-03-29 DIAGNOSIS — S86112A Strain of other muscle(s) and tendon(s) of posterior muscle group at lower leg level, left leg, initial encounter: Secondary | ICD-10-CM

## 2017-03-29 NOTE — Progress Notes (Signed)
Office Visit Note   Patient: Dalton Golden           Date of Birth: 1929/06/18           MRN: [id number] Visit Date: 03/29/2017              Requested by: Gildardo Cranker, [place], [place] [address] PCP: Gildardo Cranker, DO   Assessment & Plan: Visit Diagnoses:  1. Gastrocnemius muscle strain, left, initial encounter   2. Achilles tendinitis, left leg     Plan: We'll send him to physical therapy for gastrocsoleus stretching exercises modalities to the gastrocsoleus region and to the Achilles insertion area. Placed him with 9/16 heel lifts in both shoes. He is activities tolerated. Follow with [place] in 1 month to check his progress or lack of.  Follow-Up Instructions: Return in about 4 weeks (around 04/26/2017).   Orders:  Orders Placed This Encounter  Procedures  . XR Knee 1-2 Views Left  . Ambulatory referral to Physical Therapy   No orders of the defined types were placed in this encounter.     Procedures: No procedures performed   Clinical Data: No additional findings.   Subjective: Chief Complaint  Patient presents with  . Left Knee - Pain    HPI Dalton Golden is a 83 year old male who last night fell and twisted his leg. He is unable to bear weight. He was going down some steps trying to get out of the rain. He denies any loss consciousness dizziness or chest pain shortness breath or injury outside of the left leg. Feels like the left leg may give out on him if he puts full weight on it. He also notes that he's been treated by a podiatrist in town for Achilles tendinitis. He will like this looked at today also.  Review of Systems :No fevers chills shortness of breath. Positive for left leg pain.  Objective: Vital Signs: There were no vitals taken for this visit.  Physical Exam  Constitutional: He is oriented to person, place, and time. He appears well-developed and well-nourished. No distress.  HENT:  Head: Normocephalic and  atraumatic.  Pulmonary/Chest: Effort normal.  Neurological: He is alert and oriented to person, place, and time.  Skin: He is not diaphoretic.  Psychiatric: He has a normal mood and affect. His behavior is normal.    Ortho Exam Bilateral knees he has full range of motion of both knees without pain. No tenderness along medial lateral joint line. Left knee McMurray's negative. Valgus varus stressing of both knees reveals no laxity. There is no effusion abnormal warmth or erythema of either knee. Left medial head of the gastroc he has tenderness over this region. No tenderness on the right. Calves are supple bilaterally. Thompson tests negative bilaterally. He has tenderness at the insertion of the left Achilles. No tenderness at the medial tubercle of the calcaneus.  Specialty Comments:  No specialty comments available.  Imaging: Xr Knee 1-2 Views Left  Result Date: 03/29/2017 Left knee AP lateral: No acute fractures no bony abnormalities. Left knee overall well preserved.    PMFS History: Patient Active Problem List   Diagnosis Date Noted  . Overgrown toenails 12/10/2016  . Closed displaced fracture of right femoral neck ([place]) 10/13/2016  . Mixed hyperlipidemia 06/24/2016  . Heel spur, unspecified laterality 06/24/2016   Past Medical History:  Diagnosis Date  . Hyperlipidemia     Family History  Problem Relation Age of Onset  . Heart attack  Brother   . Heart disease Brother     Past Surgical History:  Procedure Laterality Date  . HIP SURGERY Right 07/26/2016   [facility]   Social History   Occupational History  . Not on file.   Social History Main Topics  . Smoking status: Former Smoker    Packs/day: 1.00    Years: 25.00    Types: Cigarettes    Quit date: 08/31/1958  . Smokeless tobacco: Never Used  . Alcohol use Yes     Comment: 1 drink every night  . Drug use: No  . Sexual activity: Not on file

## 2017-03-29 NOTE — Telephone Encounter (Signed)
Pt called me on call this am.  He reported a fall last night when with his sons.  He fell and twisted the left leg on some steps at a home a few doors down.  He required his two sons to support him to get back to his home.  He's been resting in bed since and has some pain in his knee, but no significant swelling.  He is "fairly comfortable" in the bed.  He tells me he was getting PT after his right hip fx and has had stiffness of left hip before this, but it just suddenly gave way last night and he fell.  I advised that because of difficulty with transport and need for imaging services, he proceed to the ED for evaluation unless there was an easy means for his sons to get him to a location where imaging was available.  His son, [name], called me back and asked about taking him to his orthopedist.  I asked that they confirm that they'd be willing to see him there today before attempting transportation b/c they may not have opening or may not feel comfortable having him come in there when he's unable to bear weight.  His son was going to contact orthopedics and take him there if possible OR take him to the ED if that is the only option.    Dalton Golden L. Dawnita Molner, D.O. [facility] [address], [place] [address] Cell Phone (Mon-Fri 8am-5pm):  [phone] On Call:  [phone] & follow prompts after 5pm & weekends Office Phone:  [phone] Office Fax:  [phone]

## 2017-04-01 ENCOUNTER — Ambulatory Visit: Payer: Medicare Other | Attending: Internal Medicine | Admitting: Physical Therapy

## 2017-04-01 ENCOUNTER — Encounter: Payer: Self-pay | Admitting: Physical Therapy

## 2017-04-01 DIAGNOSIS — M6281 Muscle weakness (generalized): Secondary | ICD-10-CM | POA: Diagnosis not present

## 2017-04-01 DIAGNOSIS — M25572 Pain in left ankle and joints of left foot: Secondary | ICD-10-CM | POA: Insufficient documentation

## 2017-04-01 DIAGNOSIS — M25651 Stiffness of right hip, not elsewhere classified: Secondary | ICD-10-CM | POA: Diagnosis not present

## 2017-04-01 DIAGNOSIS — R262 Difficulty in walking, not elsewhere classified: Secondary | ICD-10-CM | POA: Diagnosis not present

## 2017-04-01 NOTE — Therapy (Signed)
[place], [place], [address] Phone: [phone]   Fax:  [phone]  Physical Therapy Evaluation  Patient Details  Name: Dalton Golden MRN: [id number] Date of Birth: Jul 10, 1929 Referring Provider: Pete Pelt, PA-C   Encounter Date: 04/01/2017      PT End of Session - 04/01/17 1136    Visit Number 1   Number of Visits 13   Date for PT Re-Evaluation 05/14/17   Authorization Type [employer] TRAD- KX beg visit 1   PT Start Time 1136   PT Stop Time 1225   PT Time Calculation (min) 49 min   Activity Tolerance Patient tolerated treatment well   Behavior During Therapy [facility] for tasks assessed/performed      Past Medical History:  Diagnosis Date  . Hyperlipidemia     Past Surgical History:  Procedure Laterality Date  . HIP SURGERY Right 07/26/2016   [facility]    There were no vitals filed for this visit.       Subjective Assessment - 04/01/17 1139    Subjective R hip is doing really well. Last sunday evening, Walked on sidewalk to get to neighbors house for dinner. Was not very comfortable at dinner table. Walking down outside steps when it was raining. L leg twisted and he fell on L side. Had two people help hold him, L leg was weak and could not put weight on it. Still could not bear weight next morning. Walking with walker.    Patient Stated Goals get back to exercise. walk/navigate steps safely   Currently in Pain? No/denies            [facility] PT Assessment - 04/01/17 0001      Assessment   Medical Diagnosis L gastroc strain, achilles tendonitis   Referring Provider Pete Pelt, PA-C    Hand Dominance Right   Prior Therapy yes     Precautions   Precautions None     Restrictions   Weight Bearing Restrictions No     Balance Screen   Has the patient fallen in the past 6 months No     [place] residence   Living  Arrangements Alone   Additional Comments son lives 2 doors down     Prior Function   Level of Independence Independent   Vocation Retired     Associate Professor   Overall Cognitive Status Within Functional Limits for tasks assessed     Observation/Other Assessments   Focus on Therapeutic Outcomes (FOTO)  64% limitation     Sensation   Additional Comments [facility]     Strength   Strength Assessment Site Knee   Right/Left Knee Left   Left Knee Flexion 5/5   Left Knee Extension --  unable to obtain full knee ext, able to hold full ext     Palpation   Palpation comment limited flexibility gross along LE biomechanical chain     Ambulation/Gait   Assistive device Rolling walker   Gait Comments shuffling, lacking heel-toe     Standardized Balance Assessment   Standardized Balance Assessment Berg Balance Test  29/56 at eval            Objective measurements completed on examination: See above findings.                  PT Education - 04/01/17 1250    Education provided Yes   Education Details anatomy of condition, POC, HEP,  exercise form/rationale, balance/safety   Person(s) Educated Patient;Child(ren)  Son   Methods Explanation   Comprehension Verbalized understanding;Need further instruction          PT Short Term Goals - 04/01/17 1312      PT SHORT TERM GOAL #1   Title Pt will verbalize compliance with HEP, stretching at least once per day by 8/24   Baseline poor complaince at eval   Time 3   Period Weeks   Status New   Target Date 04/23/17           PT Long Term Goals - 04/01/17 1313      PT LONG TERM GOAL #1   Title BERG to increase to 35/56 to indicate significant functional balance improvement    Baseline 29/56 at eval, MDC 6.3   Time 6   Period Weeks   Status New   Target Date 05/14/17     PT LONG TERM GOAL #2   Title FOTO to 36% limitation to indicate significanct functional improvement   Baseline 64% limitation at eval   Time 6    Period Weeks   Status New   Target Date 05/14/17     PT LONG TERM GOAL #3   Title Pt will be able to navigate his home safely without the use of AD   Baseline full time RW at eval   Time 6   Period Weeks   Status New   Target Date 05/14/17                Plan - 04/01/17 1253    Clinical Impression Statement Pt presents to PT s/p fall 4 days ago while navigating stairs.  Good strength notable in quads/hamstrings in straight plane MMT. Pt did poor on BERG and is nervous about walking. Prior to fall, pt was not using an AD and now feels completely reliant on RW. Pt will benefit from skilled PT in order to challenge functional balance and improve ability to exercise for long term care.    History and Personal Factors relevant to plan of care: h/o femoral neck fracture, achilles tendonitis, heel spur, h/o fall   Clinical Presentation Evolving   Clinical Presentation due to: recent decline in balance and recent falls   Clinical Decision Making Moderate   Rehab Potential Good   PT Frequency 2x / week   PT Duration 6 weeks   PT Treatment/Interventions ADLs/Self Care Home Management;Electrical Stimulation;Iontophoresis 4mg /ml Dexamethasone;Functional mobility training;Stair training;Gait training;Ultrasound;Moist Heat;Therapeutic activities;Therapeutic exercise;Balance training;Neuromuscular re-education;Patient/family education;Passive range of motion;Manual techniques;Dry needling;Taping   PT Next Visit Plan balance challenges, flexibility   PT Home Exercise Plan gastroc stretches   Consulted and Agree with Plan of Care Patient      Patient will benefit from skilled therapeutic intervention in order to improve the following deficits and impairments:  Abnormal gait, Decreased range of motion, Difficulty walking, Increased muscle spasms, Decreased activity tolerance, Decreased knowledge of precautions, Improper body mechanics, Impaired flexibility, Decreased balance, Decreased strength,  Postural dysfunction, Decreased mobility  Visit Diagnosis: Difficulty in walking, not elsewhere classified - Plan: PT plan of care cert/re-cert      G-Codes - 85/27/78 1319    Functional Assessment Tool Used (Outpatient Only) BERG 29/56, clinical judgement   Functional Limitation Other PT subsequent   Other PT Secondary Current Status (E4235) At least 40 percent but less than 60 percent impaired, limited or restricted   Other PT Secondary Goal Status (T6144) At least 20 percent but less than 40 percent  impaired, limited or restricted       Problem List Patient Active Problem List   Diagnosis Date Noted  . Overgrown toenails 12/10/2016  . Closed displaced fracture of right femoral neck ([place]) 10/13/2016  . Mixed hyperlipidemia 06/24/2016  . Heel spur, unspecified laterality 06/24/2016    Apolinar Bero C. Gurman [place] PT, DPT 04/01/17 1:25 PM   [place], [place], [address] Phone: [phone]   Fax:  [phone]  Name: Tharun Cappella MRN: [id number] Date of Birth: 1929-03-07

## 2017-04-05 ENCOUNTER — Encounter: Payer: Self-pay | Admitting: Physical Therapy

## 2017-04-05 ENCOUNTER — Ambulatory Visit: Payer: Medicare Other | Admitting: Physical Therapy

## 2017-04-05 DIAGNOSIS — M25651 Stiffness of right hip, not elsewhere classified: Secondary | ICD-10-CM

## 2017-04-05 DIAGNOSIS — M25572 Pain in left ankle and joints of left foot: Secondary | ICD-10-CM

## 2017-04-05 DIAGNOSIS — R262 Difficulty in walking, not elsewhere classified: Secondary | ICD-10-CM

## 2017-04-05 DIAGNOSIS — M6281 Muscle weakness (generalized): Secondary | ICD-10-CM

## 2017-04-05 NOTE — Therapy (Signed)
[name], [place], [address] Phone: [phone]   Fax:  [phone]  Physical Therapy Treatment  Patient Details  Name: Dalton Golden MRN: [id number] Date of Birth: 06-08-1929 Referring Provider: Pete Pelt, PA-C   Encounter Date: 04/05/2017      PT End of Session - 04/05/17 1511    Visit Number 2   Number of Visits 13   Date for PT Re-Evaluation 05/14/17   Authorization Type [employer] TR[employer]- KX beg visit 1   PT Start Time 1511   PT Stop Time 1558   PT Time Calculation (min) 47 min   Activity Tolerance Patient tolerated treatment well   Behavior During Therapy [facility] for tasks assessed/performed      Past Medical History:  Diagnosis Date  . Hyperlipidemia     Past Surgical History:  Procedure Laterality Date  . HIP SURGERY Right 07/26/2016   [facility]    There were no vitals filed for this visit.      Subjective Assessment - 04/05/17 1511    Subjective Pt arrived on single point cane rather than RW. Pt reports he did his stretches at home, used two belts together. Registered at [facility].    Patient Stated Goals get back to exercise. walk/navigate steps safely   Currently in Pain? No/denies                         [name] - 04/05/17 0001      Knee/Hip Exercises: Aerobic   Nustep L5 6 min     Knee/Hip Exercises: Standing   Heel Raises Limitations cues to move slowly, both hands to counter.    Forward Step Up Limitations fwd step & return ball in opp hand with reach, step with return   Gait Training big steps, heel-toe, arm swing   Other Standing Knee Exercises slow marching, 1 hand to counter   Other Standing Knee Exercises church pew exercise; feet together- reach ball to cone; tandem on ariex; NBOS EO/EC on airex                PT Education - 04/05/17 1518    Education provided Yes   Education Details exercise  form/[place], HEP, gym   Person(s) Educated Patient   Methods Explanation;Demonstration;Tactile cues;Verbal cues;Handout   Comprehension Verbalized understanding;Returned demonstration;Verbal cues required;Tactile cues required;Need further instruction          PT Short Term Goals - 04/01/17 1312      PT SHORT TERM GOAL #1   Title Pt will verbalize compliance with HEP, stretching at least once per day by 8/24   Baseline poor complaince at eval   Time 3   Period Weeks   Status New   Target Date 04/23/17           PT Long Term Goals - 04/01/17 1313      PT LONG TERM GOAL #1   Title BERG to increase to 35/56 to indicate significant functional balance improvement    Baseline 29/56 at eval, MDC 6.3   Time 6   Period Weeks   Status New   Target Date 05/14/17     PT LONG TERM GOAL #2   Title FOTO to 36% limitation to indicate significanct functional improvement   Baseline 64% limitation at eval   Time 6   Period Weeks   Status New   Target Date 05/14/17     PT  LONG TERM GOAL #3   Title Pt will be able to navigate his home safely without the use of AD   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 Pt able to take large, smooth steps with cuing. Tends to shuffle when standing or beginning to walk and as he rounds corners or turns around, increasing risk to trip over feet. Nervous when performing balance tasks, PT CGA on gait belt.    PT Treatment/Interventions ADLs/Self Care Home Management;Electrical Stimulation;Iontophoresis 4mg /ml Dexamethasone;Functional mobility training;Stair training;Gait training;Ultrasound;Moist Heat;Therapeutic activities;Therapeutic exercise;Balance training;Neuromuscular re-education;Patient/family education;Passive range of motion;Manual techniques;Dry needling;Taping   PT  Next Visit Plan balance challenges, flexibility   PT Home Exercise Plan gastroc stretches, decrease shuffling    Consulted and Agree with Plan of Care Patient      Patient will benefit from skilled therapeutic intervention in order to improve the following deficits and impairments:  Abnormal gait, Decreased range of motion, Difficulty walking, Increased muscle spasms, Decreased activity tolerance, Decreased knowledge of precautions, Improper body mechanics, Impaired flexibility, Decreased balance, Decreased strength, Postural dysfunction, Decreased mobility  Visit Diagnosis: Difficulty in walking, not elsewhere classified  Pain in left ankle and joints of left foot  Muscle weakness (generalized)  Stiffness of right hip, not elsewhere classified     Problem List Patient Active Problem List   Diagnosis Date Noted  . Overgrown toenails 12/10/2016  . Closed displaced fracture of right femoral neck ([place]) 10/13/2016  . Mixed hyperlipidemia 06/24/2016  . Heel spur, unspecified laterality 06/24/2016    Ileah Falkenstein C. Tyrik Stetzer PT, DPT 04/05/17 4:06 PM   [facility] [address] [place], [place], [address] Phone: [phone]   Fax:  [phone]  Name: Dalton Golden MRN: [id number] Date of Birth: [birth year]

## 2017-04-08 ENCOUNTER — Ambulatory Visit: Payer: Medicare Other | Admitting: Physical Therapy

## 2017-04-08 ENCOUNTER — Encounter: Payer: Self-pay | Admitting: Physical Therapy

## 2017-04-08 DIAGNOSIS — M25572 Pain in left ankle and joints of left foot: Secondary | ICD-10-CM | POA: Diagnosis not present

## 2017-04-08 DIAGNOSIS — M25651 Stiffness of right hip, not elsewhere classified: Secondary | ICD-10-CM

## 2017-04-08 DIAGNOSIS — R262 Difficulty in walking, not elsewhere classified: Secondary | ICD-10-CM

## 2017-04-08 DIAGNOSIS — M6281 Muscle weakness (generalized): Secondary | ICD-10-CM | POA: Diagnosis not present

## 2017-04-08 NOTE — Therapy (Signed)
[place], [place], [address] Phone: [phone]   Fax:  [phone]  Physical Therapy Treatment  Patient Details  Name: Dalton Golden MRN: [id number] Date of Birth: 01-24-1929 Referring Provider: Pete Pelt, PA-C   Encounter Date: 04/08/2017      PT End of Session - 04/08/17 1501    Visit Number 3   Number of Visits 13   Date for PT Re-Evaluation 05/14/17   Authorization Type [employer] TR[employer]- KX beg visit 1   PT Start Time 1501   PT Stop Time 1540   PT Time Calculation (min) 39 min   Activity Tolerance Patient tolerated treatment well   Behavior During Therapy [facility] for tasks assessed/performed      Past Medical History:  Diagnosis Date  . Hyperlipidemia     Past Surgical History:  Procedure Laterality Date  . HIP SURGERY Right 07/26/2016   [facility]    There were no vitals filed for this visit.      Subjective Assessment - 04/08/17 1502    Subjective Went to [employer] down town, had to park far away because handicap spots are on a big hill. Went to [employer] which was better. Did exercises in pool.    Patient Stated Goals get back to exercise. walk/navigate steps safely   Currently in Pain? No/denies                         [name] - 04/08/17 0001      Knee/Hip Exercises: Stretches   Gastroc Stretch 2 reps;30 seconds   Gastroc Stretch Limitations slant board     Knee/Hip Exercises: Aerobic   Nustep 6 min L5     Knee/Hip Exercises: Standing   Heel Raises Limitations hands to hips   Forward Step Up Limitations onto/off of airex   SLS tandem stance & SLS   Other Standing Knee Exercises holding red physioball, trunk twists     Knee/Hip Exercises: Seated   Marching Limitations march with 3s holds, seated on dynadisk                  PT Short Term Goals - 04/01/17 1312      PT SHORT TERM GOAL #1   Title Pt  will verbalize compliance with HEP, stretching at least once per day by 8/24   Baseline poor complaince at eval   Time 3   Period Weeks   Status New   Target Date 04/23/17           PT Long Term Goals - 04/01/17 1313      PT LONG TERM GOAL #1   Title BERG to increase to 35/56 to indicate significant functional balance improvement    Baseline 29/56 at eval, MDC 6.3   Time 6   Period Weeks   Status New   Target Date 05/14/17     PT LONG TERM GOAL #2   Title FOTO to 36% limitation to indicate significanct functional improvement   Baseline 64% limitation at eval   Time 6   Period Weeks   Status New   Target Date 05/14/17     PT LONG TERM GOAL #3   Title Pt will be able to navigate his home safely without the use of AD   Baseline full time RW 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 Good cadence and step length. CGA for balance exercises, 1 incident from SLS that required assistance to maintain balance. Unable to obtain full tandem withou LOB.    PT Treatment/Interventions ADLs/Self Care Home Management;Electrical Stimulation;Iontophoresis 4mg /ml Dexamethasone;Functional mobility training;Stair training;Gait training;Ultrasound;Moist Heat;Therapeutic activities;Therapeutic exercise;Balance training;Neuromuscular re-education;Patient/family education;Passive range of motion;Manual techniques;Dry needling;Taping   PT Next Visit Plan balance challenges, flexibility   PT Home Exercise Plan gastroc stretches, decrease shuffling    Consulted and Agree with Plan of Care Patient      Patient will benefit from skilled therapeutic intervention in order to improve the following deficits and impairments:  Abnormal gait, Decreased range of motion, Difficulty walking, Increased muscle spasms, Decreased activity tolerance, Decreased  knowledge of precautions, Improper body mechanics, Impaired flexibility, Decreased balance, Decreased strength, Postural dysfunction, Decreased mobility  Visit Diagnosis: Difficulty in walking, not elsewhere classified  Pain in left ankle and joints of left foot  Muscle weakness (generalized)  Stiffness of right hip, not elsewhere classified     Problem List Patient Active Problem List   Diagnosis Date Noted  . Overgrown toenails 12/10/2016  . Closed displaced fracture of right femoral neck ([place]) 10/13/2016  . Mixed hyperlipidemia 06/24/2016  . Heel spur, unspecified laterality 06/24/2016    Dalton Golden C. Amna Welker PT, DPT 04/08/17 3:44 PM   [facility] [address] [place], [place], [address] Phone: [phone]   Fax:  [phone]  Name: Dalton Golden MRN: [id number] Date of Birth: 12-Jul-1929

## 2017-04-12 ENCOUNTER — Encounter: Payer: Self-pay | Admitting: Physical Therapy

## 2017-04-12 ENCOUNTER — Ambulatory Visit: Payer: Medicare Other | Admitting: Physical Therapy

## 2017-04-12 DIAGNOSIS — M25572 Pain in left ankle and joints of left foot: Secondary | ICD-10-CM | POA: Diagnosis not present

## 2017-04-12 DIAGNOSIS — R262 Difficulty in walking, not elsewhere classified: Secondary | ICD-10-CM | POA: Diagnosis not present

## 2017-04-12 DIAGNOSIS — M25651 Stiffness of right hip, not elsewhere classified: Secondary | ICD-10-CM

## 2017-04-12 DIAGNOSIS — M6281 Muscle weakness (generalized): Secondary | ICD-10-CM | POA: Diagnosis not present

## 2017-04-12 NOTE — Therapy (Signed)
[address] [place], [place], [address] Phone: [phone]   Fax:  [phone]  Physical Therapy Treatment  Patient Details  Name: Dalton Golden MRN: [id number] Date of Birth: 02/28/1929 Referring Provider: Pete Pelt, PA-C   Encounter Date: 04/12/2017      PT End of Session - 04/12/17 1548    Visit Number 4   Number of Visits 13   Date for PT Re-Evaluation 05/14/17   Authorization Type [employer] TRAD- KX beg visit 1   PT Start Time 1545   PT Stop Time 1625   PT Time Calculation (min) 40 min   Activity Tolerance Patient tolerated treatment well   Behavior During Therapy [facility] for tasks assessed/performed      Past Medical History:  Diagnosis Date  . Hyperlipidemia     Past Surgical History:  Procedure Laterality Date  . HIP SURGERY Right 07/26/2016   [facility]    There were no vitals filed for this visit.      Subjective Assessment - 04/12/17 1548    Subjective Went swimming since last apt. Went shopping. Not walking with AD.    Currently in Pain? No/denies                         [facility] Adult PT Treatment/Exercise - 04/12/17 0001      Knee/Hip Exercises: Stretches   Passive Hamstring Stretch Both;30 seconds     Knee/Hip Exercises: Aerobic   Nustep 10 min L5     Knee/Hip Exercises: Standing   Heel Raises Limitations hands to hips   Forward Step Up Limitations step taps 6" step  max clearance 6 steps   SLS with tennis ball bounce   Other Standing Knee Exercises tandem balance   Other Standing Knee Exercises fwd & side step/return                PT Education - 04/12/17 1630    Education provided Yes   Education Details only do balance exercises when your son is there to help, exercise form/rationale, HEP, slow down on stairs   Person(s) Educated Patient   Methods Explanation;Demonstration;Tactile cues;Verbal cues;Handout   Comprehension  Verbalized understanding;Returned demonstration;Verbal cues required;Tactile cues required;Need further instruction          PT Short Term Goals - 04/01/17 1312      PT SHORT TERM GOAL #1   Title Pt will verbalize compliance with HEP, stretching at least once per day by 8/24   Baseline poor complaince at eval   Time 3   Period Weeks   Status New   Target Date 04/23/17           PT Long Term Goals - 04/01/17 1313      PT LONG TERM GOAL #1   Title BERG to increase to 35/56 to indicate significant functional balance improvement    Baseline 29/56 at eval, MDC 6.3   Time 6   Period Weeks   Status New   Target Date 05/14/17     PT LONG TERM GOAL #2   Title FOTO to 36% limitation to indicate significanct functional improvement   Baseline 64% limitation at eval   Time 6   Period Weeks   Status New   Target Date 05/14/17     PT LONG TERM GOAL #3   Title Pt will be able to navigate his home safely without the use of AD   Baseline full  time RW at eval   Time 6   Period Weeks   Status New   Target Date 05/14/17               Plan - 04/12/17 1733    Clinical Impression Statement Pt able to correct all LOB from single leg stance independently. Cont to require cuing to avoid shuffling. Most step taps cleared was 6 in a row, educated on importance of slowing down to be sure he clears the steps.    PT Treatment/Interventions ADLs/Self Care Home Management;Electrical Stimulation;Iontophoresis 4mg /ml Dexamethasone;Functional mobility training;Stair training;Gait training;Ultrasound;Moist Heat;Therapeutic activities;Therapeutic exercise;Balance training;Neuromuscular re-education;Patient/family education;Passive range of motion;Manual techniques;Dry needling;Taping   PT Next Visit Plan balance challenges, flexibility   PT Home Exercise Plan gastroc stretches, decrease shuffling; single leg ball toss/bounce & step taps (with son home)    Consulted and Agree with Plan of Care  Patient      Patient will benefit from skilled therapeutic intervention in order to improve the following deficits and impairments:  Abnormal gait, Decreased range of motion, Difficulty walking, Increased muscle spasms, Decreased activity tolerance, Decreased knowledge of precautions, Improper body mechanics, Impaired flexibility, Decreased balance, Decreased strength, Postural dysfunction, Decreased mobility  Visit Diagnosis: Difficulty in walking, not elsewhere classified  Pain in left ankle and joints of left foot  Muscle weakness (generalized)  Stiffness of right hip, not elsewhere classified     Problem List Patient Active Problem List   Diagnosis Date Noted  . Overgrown toenails 12/10/2016  . Closed displaced fracture of right femoral neck ([place]) 10/13/2016  . Mixed hyperlipidemia 06/24/2016  . Heel spur, unspecified laterality 06/24/2016   Ladawn Boullion C. Verle Brillhart PT, DPT 04/12/17 5:35 PM   [facility] Outpatient Rehabilitation [facility] [address] [place], [place], [address] Phone: [phone]   Fax:  [phone]  Name: Dalton Golden MRN: [id number] Date of Birth: 03-Jul-1929

## 2017-04-14 ENCOUNTER — Ambulatory Visit: Payer: Medicare Other | Admitting: Physical Therapy

## 2017-04-14 ENCOUNTER — Encounter: Payer: Self-pay | Admitting: Physical Therapy

## 2017-04-14 DIAGNOSIS — M25572 Pain in left ankle and joints of left foot: Secondary | ICD-10-CM | POA: Diagnosis not present

## 2017-04-14 DIAGNOSIS — R262 Difficulty in walking, not elsewhere classified: Secondary | ICD-10-CM

## 2017-04-14 DIAGNOSIS — M25651 Stiffness of right hip, not elsewhere classified: Secondary | ICD-10-CM | POA: Diagnosis not present

## 2017-04-14 DIAGNOSIS — M6281 Muscle weakness (generalized): Secondary | ICD-10-CM

## 2017-04-14 NOTE — Therapy (Signed)
[place], [place], [address] Phone: [phone]   Fax:  [phone]  Physical Therapy Treatment  Patient Details  Name: Dalton Golden MRN: [id number] Date of Birth: 09/14/1928 Referring Provider: Pete Pelt, PA-C   Encounter Date: 04/14/2017      PT End of Session - 04/14/17 1331    Visit Number 5   Number of Visits 13   Date for PT Re-Evaluation 05/14/17   Authorization Type [employer] TRAD- KX beg visit 1   PT Start Time 1331   PT Stop Time 1411   PT Time Calculation (min) 40 min   Activity Tolerance Patient tolerated treatment well   Behavior During Therapy [name] for tasks assessed/performed      Past Medical History:  Diagnosis Date  . Hyperlipidemia     Past Surgical History:  Procedure Laterality Date  . HIP SURGERY Right 07/26/2016   [facility]    There were no vitals filed for this visit.      Subjective Assessment - 04/14/17 1331    Subjective Doing exercises a little bit at home.    Patient Stated Goals get back to exercise. walk/navigate steps safely   Currently in Pain? No/denies                         [name] - 04/14/17 0001      Knee/Hip Exercises: Aerobic   Nustep 10 min L5     Knee/Hip Exercises: Standing   SLS static holds   Other Standing Knee Exercises hurdles- alt feet over, long distance walking/step over     Knee/Hip Exercises: Seated   Marching Limitations seated on pysioball     Knee/Hip Exercises: Supine   Bridges Limitations bridge with heel raises, NBOS   Single Leg Bridge Other (comment)  bridge with marching   Other Supine Knee/Hip Exercises attempted to lay on bolster roll                PT Education - 04/14/17 1340    Education provided Yes   Education Details exercise form/rationale, yoga   Person(s) Educated Patient   Methods Explanation;Demonstration;Verbal  cues;Tactile cues   Comprehension Verbalized understanding;Returned demonstration;Verbal cues required;Tactile cues required;Need further instruction          PT Short Term Goals - 04/01/17 1312      PT SHORT TERM GOAL #1   Title Pt will verbalize compliance with HEP, stretching at least once per day by 8/24   Baseline poor complaince at eval   Time 3   Period Weeks   Status New   Target Date 04/23/17           PT Long Term Goals - 04/01/17 1313      PT LONG TERM GOAL #1   Title BERG to increase to 35/56 to indicate significant functional balance improvement    Baseline 29/56 at eval, MDC 6.3   Time 6   Period Weeks   Status New   Target Date 05/14/17     PT LONG TERM GOAL #2   Title FOTO to 36% limitation to indicate significanct functional improvement   Baseline 64% limitation at eval   Time 6   Period Weeks   Status New   Target Date 05/14/17     PT LONG TERM GOAL #3   Title Pt will be able to navigate his home safely without the use of AD  Baseline full time RW at eval   Time 6   Period Weeks   Status New   Target Date 05/14/17               Plan - 04/14/17 1412    Clinical Impression Statement able to hold SLS 5s on L and 4s on R today and independent in correcting LOB. Verbalized need to stop shuffling without cuing today.    PT Treatment/Interventions ADLs/Self Care Home Management;Electrical Stimulation;Iontophoresis 4mg /ml Dexamethasone;Functional mobility training;Stair training;Gait training;Ultrasound;Moist Heat;Therapeutic activities;Therapeutic exercise;Balance training;Neuromuscular re-education;Patient/family education;Passive range of motion;Manual techniques;Dry needling;Taping   PT Next Visit Plan balance challenges, flexibility   PT Home Exercise Plan gastroc stretches, decrease shuffling; single leg ball toss/bounce & step taps (with son home)    Consulted and Agree with Plan of Care Patient      Patient will benefit from skilled  therapeutic intervention in order to improve the following deficits and impairments:  Abnormal gait, Decreased range of motion, Difficulty walking, Increased muscle spasms, Decreased activity tolerance, Decreased knowledge of precautions, Improper body mechanics, Impaired flexibility, Decreased balance, Decreased strength, Postural dysfunction, Decreased mobility  Visit Diagnosis: Difficulty in walking, not elsewhere classified  Pain in left ankle and joints of left foot  Muscle weakness (generalized)  Stiffness of right hip, not elsewhere classified     Problem List Patient Active Problem List   Diagnosis Date Noted  . Overgrown toenails 12/10/2016  . Closed displaced fracture of right femoral neck ([place]) 10/13/2016  . Mixed hyperlipidemia 06/24/2016  . Heel spur, unspecified laterality 06/24/2016    [name] PT, DPT 04/14/17 2:13 PM   [place] [facility] [address] [place], [place], [address] Phone: [phone]   Fax:  [phone]  Name: Dalton Golden MRN: [id number] Date of Birth: [birth year]

## 2017-04-19 ENCOUNTER — Encounter: Payer: Self-pay | Admitting: Physical Therapy

## 2017-04-19 ENCOUNTER — Ambulatory Visit: Payer: Medicare Other | Admitting: Physical Therapy

## 2017-04-19 DIAGNOSIS — M6281 Muscle weakness (generalized): Secondary | ICD-10-CM | POA: Diagnosis not present

## 2017-04-19 DIAGNOSIS — M25651 Stiffness of right hip, not elsewhere classified: Secondary | ICD-10-CM | POA: Diagnosis not present

## 2017-04-19 DIAGNOSIS — M25572 Pain in left ankle and joints of left foot: Secondary | ICD-10-CM | POA: Diagnosis not present

## 2017-04-19 DIAGNOSIS — R262 Difficulty in walking, not elsewhere classified: Secondary | ICD-10-CM | POA: Diagnosis not present

## 2017-04-19 NOTE — Therapy (Signed)
[place], [place], [address] Phone: [phone]   Fax:  [phone]  Physical Therapy Treatment  Patient Details  Name: Dalton Golden MRN: [id number] Date of Birth: 1929-05-05 Referring Provider: Pete Pelt, PA-C   Encounter Date: 04/19/2017      PT End of Session - 04/19/17 1459    Visit Number 6   Number of Visits 13   Date for PT Re-Evaluation 05/14/17   Authorization Type [employer] TRAD- KX beg visit 1   PT Start Time 1500   PT Stop Time 1535   PT Time Calculation (min) 35 min   Activity Tolerance Patient tolerated treatment well   Behavior During Therapy [facility] for tasks assessed/performed      Past Medical History:  Diagnosis Date  . Hyperlipidemia     Past Surgical History:  Procedure Laterality Date  . HIP SURGERY Right 07/26/2016   [facility]    There were no vitals filed for this visit.      Subjective Assessment - 04/19/17 1459    Subjective Pt reports he is doing really well. Is going to a yoga class in the pool tomorrow.    Patient Stated Goals get back to exercise. walk/navigate steps safely   Currently in Pain? No/denies                         [name] - 04/19/17 0001      Knee/Hip Exercises: Aerobic   Nustep 10 min L5     Knee/Hip Exercises: Standing   Hip Flexion Limitations slow marches on airex   Forward Step Up Limitations 8" step taps, alt feet   Other Standing Knee Exercises tandem balance   Other Standing Knee Exercises walking- head turns, stop/go, pivots, retro                PT Education - 04/19/17 1510    Education provided Yes   Education Details exercise form/rationale   Person(s) Educated Patient   Methods Explanation;Demonstration;Tactile cues;Verbal cues   Comprehension Verbalized understanding;Returned demonstration;Verbal cues required;Tactile cues required;Need further  instruction          PT Short Term Goals - 04/01/17 1312      PT SHORT TERM GOAL #1   Title Pt will verbalize compliance with HEP, stretching at least once per day by 8/24   Baseline poor complaince at eval   Time 3   Period Weeks   Status New   Target Date 04/23/17           PT Long Term Goals - 04/01/17 1313      PT LONG TERM GOAL #1   Title BERG to increase to 35/56 to indicate significant functional balance improvement    Baseline 29/56 at eval, MDC 6.3   Time 6   Period Weeks   Status New   Target Date 05/14/17     PT LONG TERM GOAL #2   Title FOTO to 36% limitation to indicate significanct functional improvement   Baseline 64% limitation at eval   Time 6   Period Weeks   Status New   Target Date 05/14/17     PT LONG TERM GOAL #3   Title Pt will be able to navigate his home safely without the use of AD   Baseline full time RW at eval   Time 6   Period Weeks   Status New   Target Date  05/14/17               Plan - 04/19/17 1535    Clinical Impression Statement Pt reported irriration at his heel with walking challenges today. good control noted for self correction of LOB.    PT Treatment/Interventions ADLs/Self Care Home Management;Electrical Stimulation;Iontophoresis 4mg /ml Dexamethasone;Functional mobility training;Stair training;Gait training;Ultrasound;Moist Heat;Therapeutic activities;Therapeutic exercise;Balance training;Neuromuscular re-education;Patient/family education;Passive range of motion;Manual techniques;Dry needling;Taping   PT Next Visit Plan balance challenges, flexibility   PT Home Exercise Plan gastroc stretches, decrease shuffling; single leg ball toss/bounce & step taps (with son home)    Consulted and Agree with Plan of Care Patient      Patient will benefit from skilled therapeutic intervention in order to improve the following deficits and impairments:  Abnormal gait, Decreased range of motion, Difficulty walking, Increased  muscle spasms, Decreased activity tolerance, Decreased knowledge of precautions, Improper body mechanics, Impaired flexibility, Decreased balance, Decreased strength, Postural dysfunction, Decreased mobility  Visit Diagnosis: Difficulty in walking, not elsewhere classified     Problem List Patient Active Problem List   Diagnosis Date Noted  . Overgrown toenails 12/10/2016  . Closed displaced fracture of right femoral neck ([place]) 10/13/2016  . Mixed hyperlipidemia 06/24/2016  . Heel spur, unspecified laterality 06/24/2016   Shyler Holzman C. Xxavier Noon PT, DPT 04/19/17 3:37 PM   [facility] [address] [place], [place], [address] Phone: [phone]   Fax:  [phone]  Name: Dalton Golden MRN: [id number] Date of Birth: [birth year]

## 2017-04-20 DIAGNOSIS — H2513 Age-related nuclear cataract, bilateral: Secondary | ICD-10-CM | POA: Diagnosis not present

## 2017-04-21 ENCOUNTER — Ambulatory Visit: Payer: Medicare Other | Admitting: Physical Therapy

## 2017-04-21 ENCOUNTER — Encounter: Payer: Self-pay | Admitting: Physical Therapy

## 2017-04-21 DIAGNOSIS — R262 Difficulty in walking, not elsewhere classified: Secondary | ICD-10-CM | POA: Diagnosis not present

## 2017-04-21 DIAGNOSIS — M25572 Pain in left ankle and joints of left foot: Secondary | ICD-10-CM | POA: Diagnosis not present

## 2017-04-21 DIAGNOSIS — M25651 Stiffness of right hip, not elsewhere classified: Secondary | ICD-10-CM | POA: Diagnosis not present

## 2017-04-21 DIAGNOSIS — M6281 Muscle weakness (generalized): Secondary | ICD-10-CM | POA: Diagnosis not present

## 2017-04-21 NOTE — Therapy (Signed)
[place], [place], [address] Phone: [phone]   Fax:  [phone]  Physical Therapy Treatment  Patient Details  Name: Dalton Golden MRN: [id number] Date of Birth: 1929-05-23 Referring Provider: Pete Pelt, PA-C   Encounter Date: 04/21/2017      PT End of Session - 04/21/17 1503    Visit Number 7   Number of Visits 13   Date for PT Re-Evaluation 05/14/17   Authorization Type [employer] TR[employer]- KX beg visit 1   PT Start Time 1500   PT Stop Time 1539   PT Time Calculation (min) 39 min   Activity Tolerance Patient tolerated treatment well   Behavior During Therapy [facility] for tasks assessed/performed      Past Medical History:  Diagnosis Date  . Hyperlipidemia     Past Surgical History:  Procedure Laterality Date  . HIP SURGERY Right 07/26/2016   [facility]    There were no vitals filed for this visit.      Subjective Assessment - 04/21/17 1504    Subjective Did not go to yoga class yesterday. Went around and looked at the machinery. L leg still feels "gimpy"   Patient Stated Goals get back to exercise. walk/navigate steps safely   Currently in Pain? No/denies                         [facility] Adult PT Treatment/Exercise - 04/21/17 0001      Knee/Hip Exercises: Stretches   Passive Hamstring Stretch Limitations seated EOB with strap   Gastroc Stretch 2 reps;30 seconds     Knee/Hip Exercises: Aerobic   Nustep 10 min L5     Knee/Hip Exercises: Standing   Heel Raises Limitations at counter, cues for weight on ball of foot   Hip Abduction 20 reps   Hip Extension 20 reps   Forward Step Up Limitations 6" step with UE support, 4" step without UE   Other Standing Knee Exercises side stepping     Knee/Hip Exercises: Seated   Sit to Sand 15 reps                  PT Short Term Goals - 04/01/17 1312      PT SHORT TERM GOAL #1   Title Pt will  verbalize compliance with HEP, stretching at least once per day by 8/24   Baseline poor complaince at eval   Time 3   Period Weeks   Status New   Target Date 04/23/17           PT Long Term Goals - 04/01/17 1313      PT LONG TERM GOAL #1   Title BERG to increase to 35/56 to indicate significant functional balance improvement    Baseline 29/56 at eval, MDC 6.3   Time 6   Period Weeks   Status New   Target Date 05/14/17     PT LONG TERM GOAL #2   Title FOTO to 36% limitation to indicate significanct functional improvement   Baseline 64% limitation at eval   Time 6   Period Weeks   Status New   Target Date 05/14/17     PT LONG TERM GOAL #3   Title Pt will be able to navigate his home safely without the use of AD   Baseline 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 Poor stability without UE support on step. CGA through all exercises for safety.    PT Treatment/Interventions ADLs/Self Care Home Management;Electrical Stimulation;Iontophoresis 4mg /ml Dexamethasone;Functional mobility training;Stair training;Gait training;Ultrasound;Moist Heat;Therapeutic activities;Therapeutic exercise;Balance training;Neuromuscular re-education;Patient/family education;Passive range of motion;Manual techniques;Dry needling;Taping   PT Next Visit Plan balance challenges, flexibility   PT Home Exercise Plan gastroc stretches, decrease shuffling; single leg ball toss/bounce & step taps (with son home)    Consulted and Agree with Plan of Care Patient      Patient will benefit from skilled therapeutic intervention in order to improve the following deficits and impairments:  Abnormal gait, Decreased range of motion, Difficulty walking, Increased muscle spasms, Decreased activity tolerance, Decreased knowledge of precautions, Improper  body mechanics, Impaired flexibility, Decreased balance, Decreased strength, Postural dysfunction, Decreased mobility  Visit Diagnosis: Difficulty in walking, not elsewhere classified     Problem List Patient Active Problem List   Diagnosis Date Noted  . Overgrown toenails 12/10/2016  . Closed displaced fracture of right femoral neck ([place]) 10/13/2016  . Mixed hyperlipidemia 06/24/2016  . Heel spur, unspecified laterality 06/24/2016    Katalyna Socarras C. Lekeisha Arenas PT, DPT 04/21/17 3:43 PM   [place] [facility] [address] [place], [place], [address] Phone: [phone]   Fax:  [phone]  Name: Dalton Golden MRN: [id number] Date of Birth: 24-Jul-1929

## 2017-04-26 ENCOUNTER — Encounter: Payer: Self-pay | Admitting: Physical Therapy

## 2017-04-26 ENCOUNTER — Ambulatory Visit: Payer: Medicare Other | Admitting: Physical Therapy

## 2017-04-26 ENCOUNTER — Other Ambulatory Visit: Payer: Self-pay | Admitting: *Deleted

## 2017-04-26 DIAGNOSIS — M6281 Muscle weakness (generalized): Secondary | ICD-10-CM | POA: Diagnosis not present

## 2017-04-26 DIAGNOSIS — M25572 Pain in left ankle and joints of left foot: Secondary | ICD-10-CM | POA: Diagnosis not present

## 2017-04-26 DIAGNOSIS — R262 Difficulty in walking, not elsewhere classified: Secondary | ICD-10-CM

## 2017-04-26 DIAGNOSIS — M25651 Stiffness of right hip, not elsewhere classified: Secondary | ICD-10-CM | POA: Diagnosis not present

## 2017-04-26 MED ORDER — TAMSULOSIN HCL 0.4 MG PO CAPS: 0.4000 mg | ORAL_CAPSULE | Freq: Every day | ORAL | 3 refills | Status: DC

## 2017-04-26 NOTE — Therapy (Signed)
[place], [place], [address] Phone: [phone]   Fax:  [phone]  Physical Therapy Treatment/Discharge Summary  Patient Details  Name: Dalton Golden MRN: [id number] Date of Birth: 1929/04/09 Referring Provider: Pete Pelt, PA-C   Encounter Date: 04/26/2017      PT End of Session - 04/26/17 1502    Visit Number 8   Number of Visits 13   Date for PT Re-Evaluation 05/14/17   Authorization Type [employer] TR[employer]- KX beg visit 1   PT Start Time 1502   PT Stop Time 1527   PT Time Calculation (min) 25 min   Activity Tolerance Patient tolerated treatment well   Behavior During Therapy [facility] for tasks assessed/performed      Past Medical History:  Diagnosis Date  . Hyperlipidemia     Past Surgical History:  Procedure Laterality Date  . HIP SURGERY Right 07/26/2016   [facility]    There were no vitals filed for this visit.      Subjective Assessment - 04/26/17 1502    Subjective Went to [facility] and did the nustep for 30 min and did his swimming.    Patient Stated Goals get back to exercise. walk/navigate steps safely   Currently in Pain? No/denies            [facility] PT Assessment - 04/26/17 0001      Observation/Other Assessments   Focus on Therapeutic Outcomes (FOTO)  38% limitation     Standardized Balance Assessment   Standardized Balance Assessment Berg Balance Test     Berg Balance Test   Sit to Stand Able to stand without using hands and stabilize independently   Standing Unsupported Able to stand safely 2 minutes   Sitting with Back Unsupported but Feet Supported on Floor or Stool Able to sit safely and securely 2 minutes   Stand to Sit Sits safely with minimal use of hands   Transfers Able to transfer safely, minor use of hands   Standing Unsupported with Eyes Closed Able to stand 10 seconds safely   Standing Ubsupported with Feet Together Able to place feet  together independently and stand 1 minute safely   From Standing, Reach Forward with Outstretched Arm Can reach forward >12 cm safely (5")   From Standing Position, Pick up Object from [place] to pick up shoe safely and easily   From Standing Position, Turn to Look Behind Over each Shoulder Looks behind from both sides and weight shifts well   Turn 360 Degrees Able to turn 360 degrees safely in 4 seconds or less   Standing Unsupported, Alternately Place Feet on Step/Stool Able to stand independently and complete 8 steps >20 seconds   Standing Unsupported, One Foot in Front Able to take small step independently and hold 30 seconds   Standing on One Leg Tries to lift leg/unable to hold 3 seconds but remains standing independently   Total Score 49                             PT Education - 04/26/17 1530    Education provided Yes   Education Details BERG score, importance of continued exercise & stretching, safety, exercising at [facility]) Educated Patient   Methods Explanation   Comprehension Verbalized understanding          PT Short Term Goals - 04/26/17 1523      PT SHORT  TERM GOAL #1   Title Pt will verbalize compliance with HEP, stretching at least once per day by 8/24   Baseline reports doing exercises and going to the [facility]   Status Achieved           PT Long Term Goals - 04-28-2017 1508      PT LONG TERM GOAL #1   Title BERG to increase to 35/56 to indicate significant functional balance improvement    Baseline 49/56   Status Achieved     PT LONG TERM GOAL #2   Title FOTO to 36% limitation to indicate significanct functional improvement   Baseline 38% limitation on 04-29-2023, improved from 64% at eval   Status Partially Met     PT LONG TERM GOAL #3   Title Pt will be able to navigate his home safely without the use of AD   Baseline able   Status Achieved               Plan - 04/28/2017 1528    Clinical Impression Statement Pt has  made significant progress since beginning PT and is being d/c to independent program that he has established at [facility]. Encouraged pt to continue stretching and exercising as well as be mindful of safety. Pt verbalized understanding and was asked to contact us with any further questions.    PT Treatment/Interventions ADLs/Self Care Home Management;Electrical Stimulation;Iontophoresis '4mg'$ /ml Dexamethasone;Functional mobility training;Stair training;Gait training;Ultrasound;Moist Heat;Therapeutic activities;Therapeutic exercise;Balance training;Neuromuscular re-education;Patient/family education;Passive range of motion;Manual techniques;Dry needling;Taping   Consulted and Agree with Plan of Care Patient      Patient will benefit from skilled therapeutic intervention in order to improve the following deficits and impairments:  Abnormal gait, Decreased range of motion, Difficulty walking, Increased muscle spasms, Decreased activity tolerance, Decreased knowledge of precautions, Improper body mechanics, Impaired flexibility, Decreased balance, Decreased strength, Postural dysfunction, Decreased mobility  Visit Diagnosis: Difficulty in walking, not elsewhere classified       G-Codes - Apr 28, 2017 1529    Functional Assessment Tool Used (Outpatient Only) BERG 49/56, clinical judgement   Functional Limitation Other PT subsequent   Other PT Secondary Goal Status (R1594) At least 20 percent but less than 40 percent impaired, limited or restricted   Other PT Secondary Discharge Status (V8592) At least 20 percent but less than 40 percent impaired, limited or restricted      Problem List Patient Active Problem List   Diagnosis Date Noted  . Overgrown toenails 12/10/2016  . Closed displaced fracture of right femoral neck ([place]) 10/13/2016  . Mixed hyperlipidemia 06/24/2016  . Heel spur, unspecified laterality 06/24/2016   PHYSICAL THERAPY DISCHARGE SUMMARY  Visits from Start of Care: 8  Current  functional level related to goals / functional outcomes: See above   Remaining deficits: See above   Education / Equipment: Anatomy of condition, POC, HEP, exercise form/rationale  Plan: Patient agrees to discharge.  Patient goals were met. Patient is being discharged due to meeting the stated rehab goals.  ?????      Aubrina Nieman C. Gavriela Cashin PT, DPT 04/28/2017 3:31 PM   [facility] [address] [place], [place], [address] Phone: [phone]   Fax:  [phone]  Name: Dalton Golden MRN: [id number] Date of Birth: [birth year]

## 2017-04-26 NOTE — Telephone Encounter (Signed)
Patient walked in and requested refill be sent to pharmacy.

## 2017-04-28 ENCOUNTER — Ambulatory Visit: Payer: Medicare Other | Admitting: Physical Therapy

## 2017-04-29 ENCOUNTER — Ambulatory Visit (INDEPENDENT_AMBULATORY_CARE_PROVIDER_SITE_OTHER): Payer: Medicare Other | Admitting: Physician Assistant

## 2017-05-04 ENCOUNTER — Encounter: Payer: Medicare Other | Admitting: Physical Therapy

## 2017-05-06 ENCOUNTER — Encounter: Payer: Medicare Other | Admitting: Physical Therapy

## 2017-05-13 DIAGNOSIS — H2511 Age-related nuclear cataract, right eye: Secondary | ICD-10-CM | POA: Diagnosis not present

## 2017-05-13 DIAGNOSIS — H26492 Other secondary cataract, left eye: Secondary | ICD-10-CM | POA: Diagnosis not present

## 2017-05-25 ENCOUNTER — Ambulatory Visit (INDEPENDENT_AMBULATORY_CARE_PROVIDER_SITE_OTHER): Payer: Medicare Other | Admitting: Internal Medicine

## 2017-05-25 ENCOUNTER — Encounter: Payer: Self-pay | Admitting: Internal Medicine

## 2017-05-25 ENCOUNTER — Telehealth: Payer: Self-pay

## 2017-05-25 VITALS — BP 140/70 | HR 65 | Temp 97.9°F | Resp 20 | Ht 65.0 in | Wt 145.2 lb

## 2017-05-25 DIAGNOSIS — N529 Male erectile dysfunction, unspecified: Secondary | ICD-10-CM

## 2017-05-25 DIAGNOSIS — Z23 Encounter for immunization: Secondary | ICD-10-CM

## 2017-05-25 DIAGNOSIS — R3915 Urgency of urination: Secondary | ICD-10-CM | POA: Diagnosis not present

## 2017-05-25 DIAGNOSIS — R6882 Decreased libido: Secondary | ICD-10-CM | POA: Diagnosis not present

## 2017-05-25 MED ORDER — SILDENAFIL CITRATE 100 MG PO TABS: 100.0000 mg | ORAL_TABLET | Freq: Every day | ORAL | 6 refills | Status: DC | PRN

## 2017-05-25 NOTE — Patient Instructions (Addendum)
Will call with lab results  Please call back with name of "brown pill"  Continue current medications as ordered  Influenza vaccine given today  Follow up as scheduled

## 2017-05-25 NOTE — Progress Notes (Signed)
Patient ID: Dalton Golden, male   DOB: 01/30/1929, 83 y.o.   MRN: [id number]    Location:  [facility] Place of Service: OFFICE  Chief Complaint  Patient presents with  . Acute Visit    Discuss personal concerns w/ Dr. Eulas Post    HPI:  83 yo male seen today for ED sx's. He was married for 40 yrs and wife expired several yrs ago. He was her primary caregiver for 5 yrs. He recently met and has become close to another woman who also is a widowed. He would like to become intimate but has issues with obtaining and maintaining an erection. He has decreased libido. He takes a statin.  He is scheduled for OD cats on 06/13/17 with Dr Merdis Delay [facility]  Past Medical History:  Diagnosis Date  . Hyperlipidemia     Past Surgical History:  Procedure Laterality Date  . HIP SURGERY Right 07/26/2016   [facility]    Patient Care Team: Gildardo Cranker, DO as PCP - General (Internal Medicine)  Social History   Social History  . Marital status: Widowed    Spouse name: N/A  . Number of children: N/A  . Years of education: N/A   Occupational History  . Not on file.   Social History Main Topics  . Smoking status: Former Smoker    Packs/day: 1.00    Years: 25.00    Types: Cigarettes    Quit date: 08/31/1958  . Smokeless tobacco: Never Used  . Alcohol use Yes     Comment: 1 drink every night  . Drug use: No  . Sexual activity: Not on file   Other Topics Concern  . Not on file   Social History Narrative   Diet: NA   Caffeine:1 to 2 cups per day   Married:Widowed, married in [place], one stories, one person   Pets:no   Current/Past profession:Business Owner   Exercise:Yes, walking   Living Will: Yes   DNR:Yes   POA/HPOA:Yes        reports that he quit smoking about 58 years ago. His smoking use included Cigarettes. He has a 25.00 pack-year smoking history. He has never used smokeless tobacco. He reports that he drinks alcohol. He reports that he does  not use drugs.  Family History  Problem Relation Age of Onset  . Heart attack Brother   . Heart disease Brother    Family Status  Relation Status  . Mother Deceased  . Father Deceased  . Brother Alive  . Brother Deceased  . Daughter Alive  . Son Alive     No Known Allergies  Medications: Patient's Medications  New Prescriptions   No medications on file  Previous Medications   ASPIRIN 81 MG EC TABLET    Take 81 mg by mouth daily. Swallow whole.   ATORVASTATIN (LIPITOR) 40 MG TABLET    Take 0.5 tablets (20 mg total) by mouth daily. High Cholesterol   DOCUSATE CALCIUM (STOOL SOFTENER PO)    Take 1 tablet by mouth daily.   MULTIPLE VITAMIN (ONE-A-DAY MENS PO)    Take by mouth daily.   NONFORMULARY OR COMPOUNDED ITEM    [facility]  [place] Tendonitis Cream- Diclofenac 3%, Baclofen 2%, Bupivacaine 1%, Doxepin 5%, Gabapentin 6%, Ibuprofen 3%, Pentoxifylline 3% Apply 1-2 grams to affected area 3-4 times daily Qty. 120 gm 3 refills   TAMSULOSIN (FLOMAX) 0.4 MG CAPS CAPSULE    Take 1 capsule (0.4 mg total) by  mouth daily.  Modified Medications   No medications on file  Discontinued Medications   No medications on file    Review of Systems  Genitourinary: Negative for discharge, genital sores, penile pain and testicular pain.  All other systems reviewed and are negative.   Vitals:   05/25/17 0839  BP: 140/70  Pulse: 65  Resp: 20  Temp: 97.9 F (36.6 C)  TempSrc: Oral  SpO2: 96%  Weight: 145 lb 3.2 oz (65.9 kg)  Height: '5\' 5"'$  (1.651 m)   Body mass index is 24.16 kg/m.  Physical Exam  Constitutional: He is oriented to person, place, and time. He appears well-developed and well-nourished.  Neurological: He is alert and oriented to person, place, and time.  Skin: Skin is warm and dry. No rash noted.  Psychiatric: He has a normal mood and affect. His behavior is normal. Judgment and thought content normal.     Labs reviewed: No visits with results within  3 Month(s) from this visit.  Latest known visit with results is:  Appointment on 12/22/2016  Component Date Value Ref Range Status  . Sodium 12/22/2016 142  135 - 146 mmol/L Final  . Potassium 12/22/2016 4.5  3.5 - 5.3 mmol/L Final  . Chloride 12/22/2016 104  98 - 110 mmol/L Final  . CO2 12/22/2016 31  20 - 31 mmol/L Final  . Glucose, Bld 12/22/2016 118* 65 - 99 mg/dL Final  . BUN 12/22/2016 23  7 - 25 mg/dL Final  . Creat 12/22/2016 0.85  0.70 - 1.11 mg/dL Final   Comment:   For patients > or = 81 years of age: The upper reference limit for Creatinine is approximately 13% higher for people identified as African-American.     . Total Bilirubin 12/22/2016 0.6  0.2 - 1.2 mg/dL Final  . Alkaline Phosphatase 12/22/2016 60  40 - 115 U/L Final  . AST 12/22/2016 18  10 - 35 U/L Final  . ALT 12/22/2016 18  9 - 46 U/L Final  . Total Protein 12/22/2016 6.6  6.1 - 8.1 g/dL Final  . Albumin 12/22/2016 4.2  3.6 - 5.1 g/dL Final  . Calcium 12/22/2016 9.1  8.6 - 10.3 mg/dL Final  . GFR, Est African American 12/22/2016 >89  >=60 mL/min Final  . GFR, Est Non African American 12/22/2016 79  >=60 mL/min Final  . Cholesterol 12/22/2016 156  <200 mg/dL Final  . Triglycerides 12/22/2016 67  <150 mg/dL Final  . HDL 12/22/2016 72  >40 mg/dL Final  . Total CHOL/HDL Ratio 12/22/2016 2.2  <5.0 Ratio Final  . VLDL 12/22/2016 13  <30 mg/dL Final  . LDL Cholesterol 12/22/2016 71  <100 mg/dL Final  . TSH 12/22/2016 2.95  0.40 - 4.50 mIU/L Final  . WBC 12/22/2016 7.5  3.8 - 10.8 K/uL Final  . RBC 12/22/2016 4.77  4.20 - 5.80 MIL/uL Final  . Hemoglobin 12/22/2016 14.6  13.2 - 17.1 g/dL Final  . HCT 12/22/2016 43.2  38.5 - 50.0 % Final  . MCV 12/22/2016 90.6  80.0 - 100.0 fL Final  . MCH 12/22/2016 30.6  27.0 - 33.0 pg Final  . MCHC 12/22/2016 33.8  32.0 - 36.0 g/dL Final  . RDW 12/22/2016 15.0  11.0 - 15.0 % Final  . Platelets 12/22/2016 237  140 - 400 K/uL Final  . MPV 12/22/2016 9.1  7.5 - 12.5 fL Final    . Neutro Abs 12/22/2016 4500  1,500 - 7,800 cells/uL Final  . Lymphs Abs 12/22/2016  2100  850 - 3,900 cells/uL Final  . Monocytes Absolute 12/22/2016 675  200 - 950 cells/uL Final  . Eosinophils Absolute 12/22/2016 225  15 - 500 cells/uL Final  . Basophils Absolute 12/22/2016 0  0 - 200 cells/uL Final  . Neutrophils Relative % 12/22/2016 60  % Final  . Lymphocytes Relative 12/22/2016 28  % Final  . Monocytes Relative 12/22/2016 9  % Final  . Eosinophils Relative 12/22/2016 3  % Final  . Basophils Relative 12/22/2016 0  % Final  . Smear Review 12/22/2016 Criteria for review not met   Final    No results found.   Assessment/Plan   ICD-10-CM   1. Decreased libido R68.82 Testosterone Total,Free,Bio, Males  2. Erectile dysfunction, unspecified erectile dysfunction type N52.9 Testosterone Total,Free,Bio, Males    sildenafil (VIAGRA) 100 MG tablet  3. Urinary urgency R39.15   4. Need for immunization against influenza Z23 Flu Vaccine QUAD 36+ mos IM    Discussed safe sex practices  Discuss ADRs of anti-phosphodiesterase (-)s  Will call with lab results  Please call back with name of "brown pill"  Continue current medications as ordered  Influenza vaccine given today  Follow up as scheduled   Kylynn Street S. Perlie Gold  [facility] Adult [facility] [address] [place], [place] [address] [phone] Cell (Monday-Friday 8 AM - 5 PM) [phone] After 5 PM and follow prompts

## 2017-05-25 NOTE — Telephone Encounter (Signed)
noted 

## 2017-05-25 NOTE — Telephone Encounter (Signed)
1.) Patient called to inform Dr.Carter of the supplement that he started yesterday: Urivars Dietary Veggie Supplement one by mouth twice daily for bladder control (added to medication list)  2.) Patient also indicated that he dose not take a stool softener (removed from medication list)  3.) Patient added [facility] as one of his pharmacy contacts

## 2017-05-26 LAB — TESTOSTERONE TOTAL,FREE,BIO, MALES
ALBUMIN MSPROF: 4.4 g/dL (ref 3.6–5.1)
SEX HORMONE BINDING: 37 nmol/L (ref 22–77)
TESTOSTERONE BIOAVAILABLE: 111.7 ng/dL (ref 15.0–150.0)
TESTOSTERONE FREE: 55.5 pg/mL (ref 6.0–73.0)
TESTOSTERONE: 456 ng/dL (ref 250–827)

## 2017-06-10 DIAGNOSIS — H2511 Age-related nuclear cataract, right eye: Secondary | ICD-10-CM | POA: Diagnosis not present

## 2017-06-10 DIAGNOSIS — H2513 Age-related nuclear cataract, bilateral: Secondary | ICD-10-CM | POA: Diagnosis not present

## 2017-07-01 DIAGNOSIS — M79672 Pain in left foot: Secondary | ICD-10-CM | POA: Diagnosis not present

## 2017-07-01 DIAGNOSIS — M7662 Achilles tendinitis, left leg: Secondary | ICD-10-CM | POA: Diagnosis not present

## 2017-08-26 ENCOUNTER — Telehealth: Payer: Self-pay

## 2017-08-26 NOTE — Telephone Encounter (Signed)
Patient's daughter, Manuela Schwartz, called about concerns she has about her father's behavior. Manuela Schwartz would like to know if patient has been diagnosed with any type of memory loss? While he was in [place], pt was diagnosed with early dementia.   Manuela Schwartz is concerned about patient's life choices recently. She is concerned that her father is being taken advantage of by his new girlfriend. Pt recently moved in with girlfriend and completely shut out his daughter and son.   Manuela Schwartz would like to know if provider can arrange for a social worker to check on patient. I tried to explain that provider cannot manage or dictate patient's personal life or choices but I would pass concerns on to provider.

## 2017-08-26 NOTE — Telephone Encounter (Signed)
Left a message for patient's daughter to call the office to discuss Dr. Vale Haven response.

## 2017-08-26 NOTE — Telephone Encounter (Signed)
Noted. Pt had MMSE in Apr 2018 that did not reveal any red flags. He did not show signs of cognitive impairment when last seen in Sept 2018. His next appt is in June 2019. I cannot make referral to SW unless he requests it.

## 2017-08-26 NOTE — Telephone Encounter (Signed)
I spoke with Dalton Golden and she verbalized understanding. She stated that she would speak with her brother once he gets back from [place] about the memory issues that the family has noticed.

## 2017-09-14 ENCOUNTER — Ambulatory Visit (INDEPENDENT_AMBULATORY_CARE_PROVIDER_SITE_OTHER): Payer: Medicare Other | Admitting: Internal Medicine

## 2017-09-14 ENCOUNTER — Encounter: Payer: Self-pay | Admitting: Internal Medicine

## 2017-09-14 VITALS — BP 122/78 | HR 66 | Temp 97.7°F | Ht 65.0 in | Wt 143.0 lb

## 2017-09-14 DIAGNOSIS — T148XXA Other injury of unspecified body region, initial encounter: Secondary | ICD-10-CM

## 2017-09-14 DIAGNOSIS — N529 Male erectile dysfunction, unspecified: Secondary | ICD-10-CM

## 2017-09-14 DIAGNOSIS — Z23 Encounter for immunization: Secondary | ICD-10-CM

## 2017-09-14 MED ORDER — SILDENAFIL CITRATE 100 MG PO TABS: 100.0000 mg | ORAL_TABLET | Freq: Every day | ORAL | 6 refills | Status: DC | PRN

## 2017-09-14 NOTE — Progress Notes (Signed)
Patient ID: Dalton Golden, male   DOB: Mar 24, 1929, 85 y.o.   MRN: [id number]   Location:  [facility] OFFICE  Provider: DR Arletha Grippe  Code Status: FULL CODE Goals of Care:  Advanced Directives 05/25/2017  Does Patient Have a Medical Advance Directive? Yes  Type of Advance Directive [place]  Does patient want to make changes to medical advance directive? No - Patient declined  Copy of [place] in Chart? -  Would patient like information on creating a medical advance directive? -     Chief Complaint  Patient presents with  . Acute Visit    Cut on left arm, injured fixing light x 2 weeks ago. Discuss need for TD vaccine   . Medication Refill    No refills   . Medication Management    Discuss Viagra     HPI: Patient is a 84 y.o. male seen today for left arm laceration he sustained 2 weeks ago while repairing light fixture. He has been applying topical abx and keeping it clean. Last Td unknown.   He met a lovely lady, Dalton Golden, and plans to move in together later this week. He requests Rx for viagra. We discussed ADRs and potential side effects of medication including loss of vision, prolonged erection.   Past Medical History:  Diagnosis Date  . Hyperlipidemia     Past Surgical History:  Procedure Laterality Date  . HIP SURGERY Right 07/26/2016   [facility]     reports that he quit smoking about 59 years ago. His smoking use included cigarettes. He has a 25.00 pack-year smoking history. he has never used smokeless tobacco. He reports that he drinks alcohol. He reports that he does not use drugs. Social History   Socioeconomic History  . Marital status: Widowed    Spouse name: Not on file  . Number of children: Not on file  . Years of education: Not on file  . Highest education level: Not on file  Social Needs  . Financial resource strain: Not on file  . Food insecurity - worry: Not on file  . Food insecurity -  inability: Not on file  . Transportation needs - medical: Not on file  . Transportation needs - non-medical: Not on file  Occupational History  . Not on file  Tobacco Use  . Smoking status: Former Smoker    Packs/day: 1.00    Years: 25.00    Pack years: 25.00    Types: Cigarettes    Last attempt to quit: 08/31/1958    Years since quitting: 59.0  . Smokeless tobacco: Never Used  Substance and Sexual Activity  . Alcohol use: Yes    Comment: 1 drink every night  . Drug use: No  . Sexual activity: Not on file  Other Topics Concern  . Not on file  Social History Narrative   Diet: NA   Caffeine:1 to 2 cups per day   Married:Widowed, married in [place]   House:Yes, one stories, one person   Pets:no   Current/Past profession:Business Owner   Exercise:Yes, walking   Living Will: Yes   DNR:Yes   POA/HPOA:Yes    Family History  Problem Relation Age of Onset  . Heart attack Brother   . Heart disease Brother     No Known Allergies  Outpatient Encounter Medications as of 09/14/2017  Medication Sig  . aspirin 81 MG EC tablet Take 81 mg by mouth daily. Swallow whole.  Marland Kitchen atorvastatin (LIPITOR)  40 MG tablet Take 0.5 tablets (20 mg total) by mouth daily. High Cholesterol  . Multiple Vitamin (ONE-A-DAY MENS PO) Take by mouth daily.  . NONFORMULARY OR COMPOUNDED ITEM [facility]  Achilles Tendonitis Cream- Diclofenac 3%, Baclofen 2%, Bupivacaine 1%, Doxepin 5%, Gabapentin 6%, Ibuprofen 3%, Pentoxifylline 3% Apply 1-2 grams to affected area 3-4 times daily Qty. 120 gm 3 refills  . sildenafil (VIAGRA) 100 MG tablet Take 1 tablet (100 mg total) by mouth daily as needed for erectile dysfunction. May take 0.5 tab daily instead of 1 tab  . UNABLE TO FIND Med Name: Urivars Dietary Veggie Supplement: 1 by mouth twice daily for bladder control   No facility-administered encounter medications on file as of 09/14/2017.     Review of Systems:  Review of Systems  Skin: Positive for wound.    All other systems reviewed and are negative.   Health Maintenance  Topic Date Due  . PNA vac Low Risk Adult (2 of 2 - PPSV23) 06/18/2017  . TETANUS/TDAP  12/23/2026 (Originally 11/09/1947)  . INFLUENZA VACCINE  Completed    Physical Exam: Vitals:   09/14/17 0942  BP: 122/78  Pulse: 66  Temp: 97.7 F (36.5 C)  TempSrc: Oral  SpO2: 97%  Weight: 143 lb (64.9 kg)  Height: _0  (1.651 m)   Body mass index is 23.8 kg/m. Physical Exam  Constitutional: He is oriented to person, place, and time. He appears well-developed and well-nourished.  Looks well in NAD  Neurological: He is alert and oriented to person, place, and time.  Skin: Skin is warm and dry. He is not diaphoretic.     Psychiatric: He has a normal mood and affect. His behavior is normal. Judgment and thought content normal.    Labs reviewed: Basic Metabolic Panel: Recent Labs    12/22/16 0815  NA 142  K 4.5  CL 104  CO2 31  GLUCOSE 118*  BUN 23  CREATININE 0.85  CALCIUM 9.1  TSH 2.95   Liver Function Tests: Recent Labs    12/22/16 0815  AST 18  ALT 18  ALKPHOS 60  BILITOT 0.6  PROT 6.6  ALBUMIN 4.2   No results for input(s): LIPASE, AMYLASE in the last 8760 hours. No results for input(s): AMMONIA in the last 8760 hours. CBC: Recent Labs    12/22/16 0815  WBC 7.5  NEUTROABS 4,500  HGB 14.6  HCT 43.2  MCV 90.6  PLT 237   Lipid Panel: Recent Labs    12/22/16 0815  CHOL 156  HDL 72  LDLCALC 71  TRIG 67  CHOLHDL 2.2   No results found for: HGBA1C  Procedures since last visit: No results found.  Assessment/Plan   ICD-10-CM   1. Abrasion T14.8XXA Td : Tetanus/diphtheria >7yo Preservative  free   left forearm- healing  2. Erectile dysfunction, unspecified erectile dysfunction type N52.9 sildenafil (VIAGRA) 100 MG tablet  3. Need for Td vaccine Z23 Td : Tetanus/diphtheria >7yo Preservative  free    Continue topical antibiotic x 3 days. May leave area uncovered  TAKE VIAGRA  1/4TH TABLET DAILY AS NEEDED - side effects discussed  Continue other medications as ordered  Tetanus vaccine given today  Follow up as scheduled    Taquisha Phung S. Perlie Gold  [facility] [address] [place], [place] [address] [phone] Cell (Monday-Friday 8 AM - 5 PM) [phone] After 5 PM and follow prompts

## 2017-09-14 NOTE — Patient Instructions (Addendum)
Continue topical antibiotic x 3 days. May leave area uncovered  TAKE VIAGRA 1/4TH TABLET DAILY AS NEEDED - side effects discussed  Continue other medications as ordered  Tetanus vaccine given today  Follow up as scheduled

## 2017-09-15 DIAGNOSIS — N529 Male erectile dysfunction, unspecified: Secondary | ICD-10-CM | POA: Insufficient documentation

## 2017-12-15 ENCOUNTER — Telehealth: Payer: Self-pay | Admitting: Internal Medicine

## 2017-12-15 NOTE — Telephone Encounter (Signed)
I called both the home and mobile numbers to schedule AWV-S with pt.  I received messages for both numbers stating that they were no longer in service. VDM (DD)

## 2017-12-21 ENCOUNTER — Telehealth: Payer: Self-pay | Admitting: *Deleted

## 2017-12-21 NOTE — Telephone Encounter (Signed)
Patient dropped off form for Application for Handicapped [place] Registration Plate. Filled out and placed in Dr. Humberto Seals to review and sign. Printed last OV note and attached.  Once signed, call patient [phone] and then mail to him.

## 2017-12-31 ENCOUNTER — Telehealth: Payer: Self-pay | Admitting: Internal Medicine

## 2017-12-31 NOTE — Telephone Encounter (Signed)
Left msg asking pt to confirm this AWV-S w/ nurse. Last AWV 12/22/16. VDM (DD)

## 2018-01-12 ENCOUNTER — Ambulatory Visit (INDEPENDENT_AMBULATORY_CARE_PROVIDER_SITE_OTHER): Payer: Medicare Other

## 2018-01-12 VITALS — BP 122/68 | HR 74 | Temp 97.9°F | Ht 65.0 in | Wt 140.0 lb

## 2018-01-12 DIAGNOSIS — Z0000 Encounter for general adult medical examination without abnormal findings: Secondary | ICD-10-CM

## 2018-01-12 NOTE — Patient Instructions (Addendum)
Dalton Golden , Thank you for taking time to come for your Medicare Wellness Visit. I appreciate your ongoing commitment to your health goals. Please review the following plan we discussed and let me know if I can assist you in the future.   Screening recommendations/referrals: Colonoscopy excluded, over age 82 Recommended yearly ophthalmology/optometry visit for glaucoma screening and checkup Recommended yearly dental visit for hygiene and checkup  Vaccinations: Influenza vaccine up to date, due 2019 fall season Pneumococcal vaccine- please call pharmacy and ask for records Tdap vaccine up to date, due 09/15/2027 Shingles vaccine due-please get at pharmacy    Advanced directives: In chart  Conditions/risks identified: none  Next appointment: Dr. Eulas Post 02/01/2018 @ 9:45am            Dalton Dense, RN @ 01/16/2019 @ 01/16/2019 @ 10am  Preventive Care 80 Years and Older, Male Preventive care refers to lifestyle choices and visits with your health care provider that can promote health and wellness. What does preventive care include?  A yearly physical exam. This is also called an annual well check.  Dental exams once or twice a year.  Routine eye exams. Ask your health care provider how often you should have your eyes checked.  Personal lifestyle choices, including:  Daily care of your teeth and gums.  Regular physical activity.  Eating a healthy diet.  Avoiding tobacco and drug use.  Limiting alcohol use.  Practicing safe sex.  Taking low doses of aspirin every day.  Taking vitamin and mineral supplements as recommended by your health care provider. What happens during an annual well check? The services and screenings done by your health care provider during your annual well check will depend on your age, overall health, lifestyle risk factors, and family history of disease. Counseling  Your health care provider may ask you questions about your:  Alcohol  use.  Tobacco use.  Drug use.  Emotional well-being.  Home and relationship well-being.  Sexual activity.  Eating habits.  History of falls.  Memory and ability to understand (cognition).  Work and work Statistician. Screening  You may have the following tests or measurements:  Height, weight, and BMI.  Blood pressure.  Lipid and cholesterol levels. These may be checked every 5 years, or more frequently if you are over 54 years old.  Skin check.  Lung cancer screening. You may have this screening every year starting at age 2 if you have a 30-pack-year history of smoking and currently smoke or have quit within the past 15 years.  Fecal occult blood test (FOBT) of the stool. You may have this test every year starting at age 25.  Flexible sigmoidoscopy or colonoscopy. You may have a sigmoidoscopy every 5 years or a colonoscopy every 10 years starting at age 62.  Prostate cancer screening. Recommendations will vary depending on your family history and other risks.  Hepatitis C blood test.  Hepatitis B blood test.  Sexually transmitted disease (STD) testing.  Diabetes screening. This is done by checking your blood sugar (glucose) after you have not eaten for a while (fasting). You may have this done every 1-3 years.  Abdominal aortic aneurysm (AAA) screening. You may need this if you are a current or former smoker.  Osteoporosis. You may be screened starting at age 22 if you are at high risk. Talk with your health care provider about your test results, treatment options, and if necessary, the need for more tests. Vaccines  Your health care provider may recommend certain  vaccines, such as:  Influenza vaccine. This is recommended every year.  Tetanus, diphtheria, and acellular pertussis (Tdap, Td) vaccine. You may need a Td booster every 10 years.  Zoster vaccine. You may need this after age 3.  Pneumococcal 13-valent conjugate (PCV13) vaccine. One dose is  recommended after age 55.  Pneumococcal polysaccharide (PPSV23) vaccine. One dose is recommended after age 23. Talk to your health care provider about which screenings and vaccines you need and how often you need them. This information is not intended to replace advice given to you by your health care provider. Make sure you discuss any questions you have with your health care provider. Document Released: 09/13/2015 Document Revised: 05/06/2016 Document Reviewed: 06/18/2015 Elsevier Interactive Patient Education  2017 [place] Prevention in the Home Falls can cause injuries. They can happen to people of all ages. There are many things you can do to make your home safe and to help prevent falls. What can I do on the outside of my home?  Regularly fix the edges of walkways and driveways and fix any cracks.  Remove anything that might make you trip as you walk through a door, such as a raised step or threshold.  Trim any bushes or trees on the path to your home.  Use bright outdoor lighting.  Clear any walking paths of anything that might make someone trip, such as rocks or tools.  Regularly check to see if handrails are loose or broken. Make sure that both sides of any steps have handrails.  Any raised decks and porches should have guardrails on the edges.  Have any leaves, snow, or ice cleared regularly.  Use sand or salt on walking paths during winter.  Clean up any spills in your garage right away. This includes oil or grease spills. What can I do in the bathroom?  Use night lights.  Install grab bars by the toilet and in the tub and shower. Do not use towel bars as grab bars.  Use non-skid mats or decals in the tub or shower.  If you need to sit down in the shower, use a plastic, non-slip stool.  Keep the floor dry. Clean up any water that spills on the floor as soon as it happens.  Remove soap buildup in the tub or shower regularly.  Attach bath mats  securely with double-sided non-slip rug tape.  Do not have throw rugs and other things on the floor that can make you trip. What can I do in the bedroom?  Use night lights.  Make sure that you have a light by your bed that is easy to reach.  Do not use any sheets or blankets that are too big for your bed. They should not hang down onto the floor.  Have a firm chair that has side arms. You can use this for support while you get dressed.  Do not have throw rugs and other things on the floor that can make you trip. What can I do in the kitchen?  Clean up any spills right away.  Avoid walking on wet floors.  Keep items that you use a lot in easy-to-reach places.  If you need to reach something above you, use a strong step stool that has a grab bar.  Keep electrical cords out of the way.  Do not use floor polish or wax that makes floors slippery. If you must use wax, use non-skid floor wax.  Do not have throw rugs and other things  on the floor that can make you trip. What can I do with my stairs?  Do not leave any items on the stairs.  Make sure that there are handrails on both sides of the stairs and use them. Fix handrails that are broken or loose. Make sure that handrails are as long as the stairways.  Check any carpeting to make sure that it is firmly attached to the stairs. Fix any carpet that is loose or worn.  Avoid having throw rugs at the top or bottom of the stairs. If you do have throw rugs, attach them to the floor with carpet tape.  Make sure that you have a light switch at the top of the stairs and the bottom of the stairs. If you do not have them, ask someone to add them for you. What else can I do to help prevent falls?  Wear shoes that:  Do not have high heels.  Have rubber bottoms.  Are comfortable and fit you well.  Are closed at the toe. Do not wear sandals.  If you use a stepladder:  Make sure that it is fully opened. Do not climb a closed  stepladder.  Make sure that both sides of the stepladder are locked into place.  Ask someone to hold it for you, if possible.  Clearly mark and make sure that you can see:  Any grab bars or handrails.  First and last steps.  Where the edge of each step is.  Use tools that help you move around (mobility aids) if they are needed. These include:  Canes.  Walkers.  Scooters.  Crutches.  Turn on the lights when you go into a dark area. Replace any light bulbs as soon as they burn out.  Set up your furniture so you have a clear path. Avoid moving your furniture around.  If any of your floors are uneven, fix them.  If there are any pets around you, be aware of where they are.  Review your medicines with your doctor. Some medicines can make you feel dizzy. This can increase your chance of falling. Ask your doctor what other things that you can do to help prevent falls. This information is not intended to replace advice given to you by your health care provider. Make sure you discuss any questions you have with your health care provider. Document Released: 06/13/2009 Document Revised: 01/23/2016 Document Reviewed: 09/21/2014 Elsevier Interactive Patient Education  2017 [employer].

## 2018-01-12 NOTE — Progress Notes (Signed)
Subjective:   Dalton Golden is a 84 y.o. male who presents for Medicare Annual/Subsequent preventive examination.  Last AWV-12/22/2016    Objective:    Vitals: BP 122/68 (BP Location: Left Arm, Patient Position: Sitting)   Pulse 74   Temp 97.9 F (36.6 C) (Oral)   Ht 5\' 5"  (1.651 m)   Wt 140 lb (63.5 kg)   BMI 23.30 kg/m   Body mass index is 23.3 kg/m.  Advanced Directives 01/12/2018 05/25/2017 04/01/2017 02/12/2017 01/27/2017 12/22/2016 10/22/2016  Does Patient Have a Medical Advance Directive? Yes Yes No Yes Yes Yes Yes  Type of [place] administrator Power of [place] of [place] of [place];Living will [place]  Does patient want to make changes to medical advance directive? No - Patient declined No - Patient declined - - No - Patient declined No - Patient declined -  Copy of [place] in Chart? Yes - - - - Yes Yes  Would patient like information on creating a medical advance directive? - - - - - - -    Tobacco Social History   Tobacco Use  Smoking Status Former Smoker  . Packs/day: 1.00  . Years: 25.00  . Pack years: 25.00  . Types: Cigarettes  . Last attempt to quit: 08/31/1958  . Years since quitting: 59.4  Smokeless Tobacco Never Used     Counseling given: Not Answered   Clinical Intake:  Pre-visit preparation completed: No  Pain : No/denies pain     Nutritional Risks: None Diabetes: No     Interpreter Needed?: No  Information entered by :: Dalton Dense, RN  Past Medical History:  Diagnosis Date  . Hyperlipidemia    Past Surgical History:  Procedure Laterality Date  . HIP SURGERY Right 07/26/2016   [facility]   Family History  Problem Relation Age of Onset  . Heart attack Brother   . Heart disease Brother    Social History   Socioeconomic History  . Marital  status: Widowed    Spouse name: Not on file  . Number of children: Not on file  . Years of education: Not on file  . Highest education level: Not on file  Occupational History  . Not on file  Social Needs  . Financial resource strain: Not hard at all  . Food insecurity:    Worry: Never true    Inability: Never true  . Transportation needs:    Medical: No    Non-medical: No  Tobacco Use  . Smoking status: Former Smoker    Packs/day: 1.00    Years: 25.00    Pack years: 25.00    Types: Cigarettes    Last attempt to quit: 08/31/1958    Years since quitting: 59.4  . Smokeless tobacco: Never Used  Substance and Sexual Activity  . Alcohol use: Yes    Comment: 1 drink every night  . Drug use: No  . Sexual activity: Not on file  Lifestyle  . Physical activity:    Days per week: 1 day    Minutes per session: 30 min  . Stress: Not at all  Relationships  . Social connections:    Talks on phone: More than three times a week    Gets together: More than three times a week    Attends religious service: More than 4 times per year    Active member  of club or organization: No    Attends meetings of clubs or organizations: Never    Relationship status: Widowed  Other Topics Concern  . Not on file  Social History Narrative   Diet: NA   Caffeine:1 to 2 cups per day   Married:Widowed, married in [place]   House:Yes, one stories, one person   Pets:no   Current/Past profession:Business Owner   Exercise:Yes, walking   Living Will: Yes   DNR:Yes   POA/HPOA:Yes    Outpatient Encounter Medications as of 01/12/2018  Medication Sig  . aspirin 81 MG EC tablet Take 81 mg by mouth daily. Swallow whole.  Marland Kitchen atorvastatin (LIPITOR) 40 MG tablet Take 0.5 tablets (20 mg total) by mouth daily. High Cholesterol  . Multiple Vitamin (ONE-A-DAY MENS PO) Take by mouth daily.  . NONFORMULARY OR COMPOUNDED ITEM [facility]  Achilles Tendonitis Cream- Diclofenac 3%, Baclofen 2%, Bupivacaine 1%,  Doxepin 5%, Gabapentin 6%, Ibuprofen 3%, Pentoxifylline 3% Apply 1-2 grams to affected area 3-4 times daily Qty. 120 gm 3 refills  . sildenafil (VIAGRA) 100 MG tablet Take 1 tablet (100 mg total) by mouth daily as needed for erectile dysfunction. May take 0.25 tab daily instead of 1 tab  . [DISCONTINUED] UNABLE TO FIND Med Name: Urivars Dietary Veggie Supplement: 1 by mouth twice daily for bladder control   No facility-administered encounter medications on file as of 01/12/2018.     Activities of Daily Living In your present state of health, do you have any difficulty performing the following activities: 01/12/2018  Hearing? N  Vision? N  Difficulty concentrating or making decisions? N  Walking or climbing stairs? N  Dressing or bathing? N  Doing errands, shopping? N  Preparing Food and eating ? N  Using the Toilet? N  In the past six months, have you accidently leaked urine? N  Do you have problems with loss of bowel control? N  Managing your Medications? N  Managing your Finances? N  Housekeeping or managing your Housekeeping? N  Some recent data might be hidden    Patient Care Team: Dalton Cranker, DO as PCP - General (Internal Medicine)   Assessment:   This is a routine wellness examination for [name].  Exercise Activities and Dietary recommendations Current Exercise Habits: Home exercise routine, Type of exercise: strength training/weights, Time (Minutes): 30, Frequency (Times/Week): 1, Weekly Exercise (Minutes/Week): 30, Exercise limited by: None identified  Goals    . Exercise 3x per week (30 min per time)     Starting 12/22/2016 I will start going to the gym using Silver Sneakers.       Fall Risk Fall Risk  01/12/2018 09/14/2017 01/27/2017 12/22/2016 10/07/2016  Falls in the past year? No Yes Yes Yes Yes  Number falls in past yr: - 1 1 1 1   Injury with Fall? - Yes No Yes Yes  Comment - Skin tear left arm  - Left hip replacement -   Is the patient's home free of loose  throw rugs in walkways, pet beds, electrical cords, etc?   yes      Grab bars in the bathroom? yes      Handrails on the stairs?   yes      Adequate lighting?   yes  Depression Screen PHQ 2/9 Scores 01/12/2018 01/27/2017 12/22/2016 06/24/2016  PHQ - 2 Score 0 0 0 0    Cognitive Function MMSE - Mini Mental State Exam 01/12/2018 12/22/2016  Orientation to time 3 5  Orientation to Place 4  5  Registration 3 3  Attention/ Calculation 4 5  Recall 1 1  Language- name 2 objects 2 2  Language- repeat 1 1  Language- follow 3 step command 3 3  Language- read & follow direction 1 1  Write a sentence 1 1  Copy design 1 1  Total score 24 28        Immunization History  Administered Date(s) Administered  . Influenza,inj,Quad PF,6+ Mos 05/25/2017  . Influenza-Unspecified 06/03/2016  . Td 09/14/2017    Qualifies for Shingles Vaccine? Yes, educated and patient said he will think about i  Screening Tests Health Maintenance  Topic Date Due  . PNA vac Low Risk Adult (2 of 2 - PPSV23) 06/18/2017  . INFLUENZA VACCINE  03/31/2018  . TETANUS/TDAP  09/15/2027   Cancer Screenings: Lung: Low Dose CT Chest recommended if Age 60-80 years, 30 pack-year currently smoking OR have quit w/in 15years. Patient does not qualify. Colorectal: up to date  Additional Screenings:  Hepatitis C Screening:declined PNA vaccines may be due. Patient is going to check past records with pharmacy      Plan:    I have personally reviewed and addressed the Medicare Annual Wellness questionnaire and have noted the following in the patient's chart:  A. Medical and social history B. Use of alcohol, tobacco or illicit drugs  C. Current medications and supplements D. Functional ability and status E.  Nutritional status F.  Physical activity G. Advance directives H. List of other physicians I.  Hospitalizations, surgeries, and ER visits in previous 12 months J.  [place] to include hearing, vision,  cognitive, depression L. Referrals and appointments - none  In addition, I have reviewed and discussed with patient certain preventive protocols, quality metrics, and best practice recommendations. A written personalized care plan for preventive services as well as general preventive health recommendations were provided to patient.  See attached scanned questionnaire for additional information.   Signed,   Dalton Dense, RN Nurse Health Advisor  Patient Concerns: None

## 2018-02-01 ENCOUNTER — Ambulatory Visit (INDEPENDENT_AMBULATORY_CARE_PROVIDER_SITE_OTHER): Payer: Medicare Other | Admitting: Internal Medicine

## 2018-02-01 ENCOUNTER — Encounter: Payer: Self-pay | Admitting: Internal Medicine

## 2018-02-01 VITALS — BP 122/70 | HR 66 | Temp 98.0°F | Resp 10 | Ht 65.0 in | Wt 139.6 lb

## 2018-02-01 DIAGNOSIS — N529 Male erectile dysfunction, unspecified: Secondary | ICD-10-CM

## 2018-02-01 DIAGNOSIS — E782 Mixed hyperlipidemia: Secondary | ICD-10-CM | POA: Diagnosis not present

## 2018-02-01 DIAGNOSIS — R739 Hyperglycemia, unspecified: Secondary | ICD-10-CM | POA: Diagnosis not present

## 2018-02-01 DIAGNOSIS — Z79899 Other long term (current) drug therapy: Secondary | ICD-10-CM | POA: Insufficient documentation

## 2018-02-01 DIAGNOSIS — R21 Rash and other nonspecific skin eruption: Secondary | ICD-10-CM | POA: Insufficient documentation

## 2018-02-01 DIAGNOSIS — N400 Enlarged prostate without lower urinary tract symptoms: Secondary | ICD-10-CM | POA: Diagnosis not present

## 2018-02-01 LAB — CBC WITH DIFFERENTIAL/PLATELET
BASOS PCT: 0.5 %
Basophils Absolute: 43 cells/uL (ref 0–200)
EOS PCT: 1.8 %
Eosinophils Absolute: 153 cells/uL (ref 15–500)
HEMATOCRIT: 41.8 % (ref 38.5–50.0)
HEMOGLOBIN: 14.7 g/dL (ref 13.2–17.1)
LYMPHS ABS: 2108 {cells}/uL (ref 850–3900)
MCH: 32 pg (ref 27.0–33.0)
MCHC: 35.2 g/dL (ref 32.0–36.0)
MCV: 91.1 fL (ref 80.0–100.0)
MONOS PCT: 10.3 %
MPV: 9.3 fL (ref 7.5–12.5)
NEUTROS ABS: 5321 {cells}/uL (ref 1500–7800)
Neutrophils Relative %: 62.6 %
Platelets: 244 10*3/uL (ref 140–400)
RBC: 4.59 10*6/uL (ref 4.20–5.80)
RDW: 12.6 % (ref 11.0–15.0)
Total Lymphocyte: 24.8 %
WBC mixed population: 876 cells/uL (ref 200–950)
WBC: 8.5 10*3/uL (ref 3.8–10.8)

## 2018-02-01 LAB — COMPLETE METABOLIC PANEL WITH GFR
AG RATIO: 1.9 (calc) (ref 1.0–2.5)
ALBUMIN MSPROF: 4.4 g/dL (ref 3.6–5.1)
ALT: 22 U/L (ref 9–46)
AST: 20 U/L (ref 10–35)
Alkaline phosphatase (APISO): 60 U/L (ref 40–115)
BUN: 20 mg/dL (ref 7–25)
CALCIUM: 9.9 mg/dL (ref 8.6–10.3)
CO2: 32 mmol/L (ref 20–32)
CREATININE: 0.84 mg/dL (ref 0.70–1.11)
Chloride: 103 mmol/L (ref 98–110)
GFR, EST AFRICAN AMERICAN: 91 mL/min/{1.73_m2} (ref >=60)
GFR, EST NON AFRICAN AMERICAN: 79 mL/min/{1.73_m2} (ref >=60)
Globulin: 2.3 g/dL (calc) (ref 1.9–3.7)
Glucose, Bld: 84 mg/dL (ref 65–139)
POTASSIUM: 4.6 mmol/L (ref 3.5–5.3)
SODIUM: 141 mmol/L (ref 135–146)
Total Bilirubin: 0.6 mg/dL (ref 0.2–1.2)
Total Protein: 6.7 g/dL (ref 6.1–8.1)

## 2018-02-01 LAB — URINALYSIS, ROUTINE W REFLEX MICROSCOPIC
Bilirubin Urine: NEGATIVE
Glucose, UA: NEGATIVE
Hgb urine dipstick: NEGATIVE
Ketones, ur: NEGATIVE
Leukocytes, UA: NEGATIVE
NITRITE: NEGATIVE
PROTEIN: NEGATIVE
Specific Gravity, Urine: 1.021 (ref 1.001–1.03)
pH: 5.5 (ref 5.0–8.0)

## 2018-02-01 LAB — LIPID PANEL
CHOL/HDL RATIO: 2.7 (calc) (ref <5.0)
Cholesterol: 142 mg/dL (ref <200)
HDL: 53 mg/dL (ref >40)
LDL Cholesterol (Calc): 74 mg/dL (calc)
NON-HDL CHOLESTEROL (CALC): 89 mg/dL (ref <130)
Triglycerides: 73 mg/dL (ref <150)

## 2018-02-01 LAB — TSH: TSH: 2.98 m[IU]/L (ref 0.40–4.50)

## 2018-02-01 MED ORDER — ATORVASTATIN CALCIUM 40 MG PO TABS: 20.0000 mg | ORAL_TABLET | Freq: Every day | ORAL | 3 refills | Status: DC

## 2018-02-01 NOTE — Progress Notes (Signed)
Patient ID: Dalton Golden, male   DOB: 1928-10-26, 84 y.o.   MRN: [id number]   Location:  [facility]  Place of Service:  OFFICE  Provider: Arletha Grippe, DO  Patient Care Team: Gildardo Cranker, DO as PCP - General (Internal Medicine)  Extended Emergency Contact Information Primary Emergency Contact: Fickle,[name] Address: [address]          [name], [place] [address] [name] of [place] Phone: [phone] Mobile Phone: [phone] Relation: Son  Code Status:  Goals of Care: Advanced Directive information Advanced Directives 01/12/2018  Does Patient Have a Medical Advance Directive? Yes  Type of Advance Directive [place]  Does patient want to make changes to medical advance directive? No - Patient declined  Copy of [place] in Chart? Yes  Would patient like information on creating a medical advance directive? -     Chief Complaint  Patient presents with  . Medical Management of Chronic Issues    Extended visit, AWV completed 01/12/18, - fall, - depression, MMSE  . Medication Refill    Refill Lipitor- 90 day supply to [facility]     HPI: Patient is a 84 y.o. male seen in today for an extended visit, AWV completed 01/12/18. MMSE 24/30. His fiance [name] brought him to the office today. He has not tried viagra yet as she was c/a affecting his health.  Hx right hip fx 2/2 mechanical fall s/p right hip ORIF 07/26/16 followed by several weeks of rehab in [place], [place] - He completed o/p PT  Hyperlipidemia - stable on lipitor. No myalgias. LDL 71; HDL 72  He takes ASA and MVI daily  He is retired from [employer]   Depression screen [facility] 2/9 02/01/2018 01/12/2018 01/27/2017 12/22/2016 06/24/2016  Decreased Interest 0 0 0 0 0  Down, Depressed, Hopeless 0 0 0 0 0  PHQ - 2 Score 0 0 0 0 0    Fall Risk  02/01/2018 01/12/2018 09/14/2017 01/27/2017 12/22/2016  Falls in the past year? No No Yes Yes Yes  Number falls in past  yr: - - '1 1 1  '$ Injury with Fall? - - Yes No Yes  Comment - - Skin tear left arm  - Left hip replacement   MMSE - Mini Mental State Exam 01/12/2018 12/22/2016  Orientation to time 3 5  Orientation to Place 4 5  Registration 3 3  Attention/ Calculation 4 5  Recall 1 1  Language- name 2 objects 2 2  Language- repeat 1 1  Language- follow 3 step command 3 3  Language- read & follow direction 1 1  Write a sentence 1 1  Copy design 1 1  Total score 24 28     Health Maintenance  Topic Date Due  . PNA vac Low Risk Adult (2 of 2 - PPSV23) 06/18/2017  . INFLUENZA VACCINE  03/31/2018  . TETANUS/TDAP  09/15/2027    Past Medical History:  Diagnosis Date  . Hyperlipidemia     Past Surgical History:  Procedure Laterality Date  . HIP SURGERY Right 07/26/2016   [facility]    Family History  Problem Relation Age of Onset  . Heart attack Brother   . Heart disease Brother    Family Status  Relation Name Status  . Mother Dorian Pod Deceased  . Father Glendell Docker Deceased  . Brother [employer]  . Brother Roger Deceased  . Daughter Daralene Milch  . Son Ulice Dash Alive    Social History  Socioeconomic History  . Marital status: Widowed    Spouse name: Not on file  . Number of children: Not on file  . Years of education: Not on file  . Highest education level: Not on file  Occupational History  . Not on file  Social Needs  . Financial resource strain: Not hard at all  . Food insecurity:    Worry: Never true    Inability: Never true  . Transportation needs:    Medical: No    Non-medical: No  Tobacco Use  . Smoking status: Former Smoker    Packs/day: 1.00    Years: 25.00    Pack years: 25.00    Types: Cigarettes    Last attempt to quit: 08/31/1958    Years since quitting: 59.4  . Smokeless tobacco: Never Used  Substance and Sexual Activity  . Alcohol use: Yes    Comment: 1 drink every night  . Drug use: No  . Sexual activity: Not on file  Lifestyle  . Physical  activity:    Days per week: 1 day    Minutes per session: 30 min  . Stress: Not at all  Relationships  . Social connections:    Talks on phone: More than three times a week    Gets together: More than three times a week    Attends religious service: More than 4 times per year    Active member of club or organization: No    Attends meetings of clubs or organizations: Never    Relationship status: Widowed  . Intimate partner violence:    Fear of current or ex partner: No    Emotionally abused: No    Physically abused: No    Forced sexual activity: No  Other Topics Concern  . Not on file  Social History Narrative   Diet: NA   Caffeine:1 to 2 cups per day   Married:Widowed, married in [place]   House:Yes, one stories, one person   Pets:no   Current/Past profession:Business Owner   Exercise:Yes, walking   Living Will: Yes   DNR:Yes   POA/HPOA:Yes    No Known Allergies  Allergies as of 02/01/2018   No Known Allergies     Medication List        Accurate as of 02/01/18 10:14 AM. Always use your most recent med list.          aspirin 81 MG EC tablet Take 81 mg by mouth daily. Swallow whole.   atorvastatin 40 MG tablet Commonly known as:  LIPITOR Take 0.5 tablets (20 mg total) by mouth daily. High Cholesterol   NONFORMULARY OR COMPOUNDED ITEM [facility]  Achilles Tendonitis Cream- Diclofenac 3%, Baclofen 2%, Bupivacaine 1%, Doxepin 5%, Gabapentin 6%, Ibuprofen 3%, Pentoxifylline 3% Apply 1-2 grams to affected area 3-4 times daily Qty. 120 gm 3 refills   ONE-A-DAY MENS PO Take by mouth daily.   sildenafil 100 MG tablet Commonly known as:  VIAGRA Take 1 tablet (100 mg total) by mouth daily as needed for erectile dysfunction. May take 0.25 tab daily instead of 1 tab        Review of Systems:  Review of Systems  Genitourinary:       ED sx's  Skin: Positive for rash (on buttock x several yrs and unchanged).  All other systems reviewed and are  negative.   Physical Exam: Vitals:   02/01/18 0955  BP: 122/70  Pulse: 66  Resp: 10  Temp: 98 F (36.7 C)  TempSrc: Oral  SpO2: 95%  Weight: 139 lb 9.6 oz (63.3 kg)  Height: '5\' 5"'$  (1.651 m)   Body mass index is 23.23 kg/m. Physical Exam  Constitutional: He is oriented to person, place, and time. He appears well-developed and well-nourished. No distress.  HENT:  Head: Normocephalic and atraumatic.  Right Ear: Hearing, tympanic membrane, external ear and ear canal normal.  Left Ear: Hearing, tympanic membrane, external ear and ear canal normal.  Mouth/Throat: Uvula is midline, oropharynx is clear and moist and mucous membranes are normal. He does not have dentures.  MMM; no oral thrush; increased cerumen in right external ear canal  Eyes: Pupils are equal, round, and reactive to light. Conjunctivae, EOM and lids are normal. No scleral icterus.  Neck: Trachea normal and normal range of motion. Neck supple. Carotid bruit is not present. No thyroid mass and no thyromegaly present.  Cardiovascular: Normal rate and intact distal pulses. An irregular rhythm present. Exam reveals no gallop and no friction rub.  Murmur (1/6 SEM) heard. no distal LE swelling. No calf TTP  Pulmonary/Chest: Effort normal and breath sounds normal. He has no wheezes. He has no rhonchi. He has no rales. He exhibits no mass and no tenderness. Right breast exhibits no inverted nipple, no mass, no nipple discharge, no skin change and no tenderness. Left breast exhibits no inverted nipple, no mass, no nipple discharge, no skin change and no tenderness. Breasts are symmetrical.  Abdominal: Soft. Normal appearance, normal aorta and bowel sounds are normal. He exhibits no distension, no abdominal bruit, no pulsatile midline mass and no mass. There is no hepatosplenomegaly or hepatomegaly. There is no tenderness. There is no rigidity, no rebound and no guarding. No hernia. Hernia confirmed negative in the right inguinal area  and confirmed negative in the left inguinal area.  Genitourinary: Testes normal and penis normal. Rectal exam shows external hemorrhoid (no bleeding). Rectal exam shows no internal hemorrhoid, no fissure, no mass, no tenderness, anal tone normal and guaiac negative stool. Prostate is enlarged (smooth firm). Prostate is not tender. Circumcised.  Genitourinary Comments: Chaperone present  Musculoskeletal: Normal range of motion. He exhibits edema. He exhibits no deformity.  Lymphadenopathy:       Head (right side): No posterior auricular adenopathy present.       Head (left side): No posterior auricular adenopathy present.    He has no cervical adenopathy. No inguinal adenopathy noted on the right or left side.       Right: No supraclavicular adenopathy present.       Left: No supraclavicular adenopathy present.  Neurological: He is alert and oriented to person, place, and time. He has normal strength and normal reflexes. No cranial nerve deficit. Gait normal.  Skin: Skin is warm, dry and intact. Rash noted. Rash is papular (papulosquamous, raised, NT with no vesicular area on inner right buttock). Nails show no clubbing.  Psychiatric: He has a normal mood and affect. His speech is normal and behavior is normal. Judgment and thought content normal. Cognition and memory are normal.    Labs reviewed:  Basic Metabolic Panel: No results for input(s): NA, K, CL, CO2, GLUCOSE, BUN, CREATININE, CALCIUM, MG, PHOS, TSH in the last 8760 hours. Liver Function Tests: No results for input(s): AST, ALT, ALKPHOS, BILITOT, PROT, ALBUMIN in the last 8760 hours. No results for input(s): LIPASE, AMYLASE in the last 8760 hours. No results for input(s): AMMONIA in the last 8760 hours. CBC: No results for input(s): WBC, NEUTROABS, HGB, HCT, MCV,  PLT in the last 8760 hours. Lipid Panel: No results for input(s): CHOL, HDL, LDLCALC, TRIG, CHOLHDL, LDLDIRECT in the last 8760 hours. No results found for:  HGBA1C  Procedures: No results found. ECG OBTAINED AND REVIEWED BY MYSELF: 1st degree AVB @ 60 bpm, LAD, LAE, PACs. No acute ischemic changes. No other ECG available to compare  Assessment/Plan   ICD-10-CM   1. Mixed hyperlipidemia E78.2 EKG 12-Lead    Lipid Panel    TSH  2. Erectile dysfunction, unspecified erectile dysfunction type N52.9   3. High risk medication use Z79.899 EKG 12-Lead    CMP with eGFR([facility])    CBC with Differential/Platelets  4. Hyperglycemia R73.9   5. Enlarged prostate on rectal examination N40.0 Urinalysis with Reflex Microscopic   May take 1/4 th tablet as needed of viagra  Continue other medications as ordered  Will call with lab results  Follow up in 6 mos for hyperlipidemia  Keeping you healthy  Christionna Poland S. Perlie Gold  [facility] [address] [place], [place] [address] [phone] Cell (Monday-Friday 8 AM - 5 PM) [phone] After 5 PM and follow prompts

## 2018-02-01 NOTE — Patient Instructions (Addendum)
May take 1/4 th tablet as needed of viagra  Continue other medications as ordered  Will call with lab results  Follow up in 6 mos for hyperlipidemia  Keeping you healthy  Get these tests  Blood pressure- Have your blood pressure checked once a year by your healthcare provider.  Normal blood pressure is 120/80  Weight- Have your body mass index (BMI) calculated to screen for obesity.  BMI is a measure of body fat based on height and weight. You can also calculate your own BMI at ViewBanking.si.  Cholesterol- Have your cholesterol checked every year.  Diabetes- Have your blood sugar checked regularly if you have high blood pressure, high cholesterol, have a family history of diabetes or if you are overweight.  Screening for Colon Cancer- Colonoscopy starting at age 24.  Screening may begin sooner depending on your family history and other health conditions. Follow up colonoscopy as directed by your Gastroenterologist.  Screening for Prostate Cancer- Both blood work (PSA) and a rectal exam help screen for Prostate Cancer.  Screening begins at age 4 with African-American men and at age 20 with Caucasian men.  Screening may begin sooner depending on your family history.  Take these medicines  Aspirin- One aspirin daily can help prevent Heart disease and Stroke.  Flu shot- Every fall.  Tetanus- Every 10 years.  Zostavax- Once after the age of 65 to prevent Shingles.  Pneumonia shot- Once after the age of 42; if you are younger than 64, ask your healthcare provider if you need a Pneumonia shot.  Take these steps  Don't smoke- If you do smoke, talk to your doctor about quitting.  For tips on how to quit, go to www.smokefree.gov or call 1-800-QUIT-NOW.  Be physically active- Exercise 5 days a week for at least 30 minutes.  If you are not already physically active start slow and gradually work up to 30 minutes of moderate physical activity.  Examples of moderate activity  include walking briskly, mowing the yard, dancing, swimming, bicycling, etc.  Eat a healthy diet- Eat a variety of healthy food such as fruits, vegetables, low fat milk, low fat cheese, yogurt, lean meant, poultry, fish, beans, tofu, etc. For more information go to www.thenutritionsource.org  Drink alcohol in moderation- Limit alcohol intake to less than two drinks a day. Never drink and drive.  Dentist- Brush and floss twice daily; visit your dentist twice a year.  Depression- Your emotional health is as important as your physical health. If you're feeling down, or losing interest in things you would normally enjoy please talk to your healthcare provider.  Eye exam- Visit your eye doctor every year.  Safe sex- If you may be exposed to a sexually transmitted infection, use a condom.  Seat belts- Seat belts can save your life; always wear one.  Smoke/Carbon Monoxide detectors- These detectors need to be installed on the appropriate level of your home.  Replace batteries at least once a year.  Skin cancer- When out in the sun, cover up and use sunscreen 15 SPF or higher.  Violence- If anyone is threatening you, please tell your healthcare provider.  Living Will/ Health care power of attorney- Speak with your healthcare provider and family.

## 2018-04-20 ENCOUNTER — Encounter: Payer: Self-pay | Admitting: Internal Medicine

## 2018-06-21 ENCOUNTER — Ambulatory Visit: Payer: Self-pay | Admitting: Nurse Practitioner

## 2018-06-21 ENCOUNTER — Ambulatory Visit: Payer: Medicare Other | Admitting: Nurse Practitioner

## 2018-06-27 DIAGNOSIS — Z23 Encounter for immunization: Secondary | ICD-10-CM | POA: Diagnosis not present

## 2018-09-20 ENCOUNTER — Encounter: Payer: Self-pay | Admitting: Family

## 2018-09-20 ENCOUNTER — Ambulatory Visit (INDEPENDENT_AMBULATORY_CARE_PROVIDER_SITE_OTHER): Payer: Medicare Other | Admitting: Family

## 2018-09-20 VITALS — BP 118/80 | HR 60 | Temp 97.2°F | Ht 65.0 in | Wt 143.4 lb

## 2018-09-20 DIAGNOSIS — K5901 Slow transit constipation: Secondary | ICD-10-CM | POA: Diagnosis not present

## 2018-09-20 MED ORDER — DOCUSATE SODIUM 100 MG PO CAPS: 100.0000 mg | ORAL_CAPSULE | Freq: Two times a day (BID) | ORAL | 0 refills | Status: DC

## 2018-09-20 NOTE — Patient Instructions (Addendum)
1.Drink one glass of warm prune juice daily  2. Take colace 100 mg capsule one by mouth daily as needed for constipation. 3. Notified provider if no bowel movement x 3 days  4. Drink 6-8 glasses of water daily    Constipation, Adult Constipation is when a person:  Poops (has a bowel movement) fewer times in a week than normal.  Has a hard time pooping.  Has poop that is dry, hard, or bigger than normal. Follow these instructions at home: Eating and drinking   Eat foods that have a lot of fiber, such as: ? Fresh fruits and vegetables. ? Whole grains. ? Beans.  Eat less of foods that are high in fat, low in fiber, or overly processed, such as: ? [place] fries. ? Hamburgers. ? Cookies. ? Candy. ? Soda.  Drink enough fluid to keep your pee (urine) clear or pale yellow. General instructions  Exercise regularly or as told by your doctor.  Go to the restroom when you feel like you need to poop. Do not hold it in.  Take over-the-counter and prescription medicines only as told by your doctor. These include any fiber supplements.  Do pelvic floor retraining exercises, such as: ? Doing deep breathing while relaxing your lower belly (abdomen). ? Relaxing your pelvic floor while pooping.  Watch your condition for any changes.  Keep all follow-up visits as told by your doctor. This is important. Contact a doctor if:  You have pain that gets worse.  You have a fever.  You have not pooped for 4 days.  You throw up (vomit).  You are not hungry.  You lose weight.  You are bleeding from the anus.  You have thin, pencil-like poop (stool). Get help right away if:  You have a fever, and your symptoms suddenly get worse.  You leak poop or have blood in your poop.  Your belly feels hard or bigger than normal (is bloated).  You have very bad belly pain.  You feel dizzy or you faint. This information is not intended to replace advice given to you by your health care  provider. Make sure you discuss any questions you have with your health care provider. Document Released: 02/03/2008 Document Revised: 03/06/2016 Document Reviewed: 02/05/2016 Elsevier Interactive Patient Education  2019 [employer].

## 2018-09-20 NOTE — Progress Notes (Signed)
Provider: Marlowe Sax FNP-C  [name], Nelda Bucks, NP  Patient Care Team: Dalton Hughs, NP as PCP - General (Family Medicine)  Extended Emergency Contact Information Primary Emergency Contact: Dalton Golden,Dalton Golden Address: [address]          [name], [place] [address] [name] of [place] Phone: [phone] Mobile Phone: [phone] Relation: Son  Goals of care: Advanced Directive information Advanced Directives 09/20/2018  Does Patient Have a Medical Advance Directive? Yes  Type of Advance Directive Dalton Golden  Does patient want to make changes to medical advance directive? No - Patient declined  Copy of [place] in Chart? Yes - validated most recent copy scanned in chart (See row information)  Would patient like information on creating a medical advance directive? -     Chief Complaint  Patient presents with  . Acute Visit    Patient c/o of constipation, takes ex lax 1 or 2 week, duration of 6 or 7 months daughter does not think drink plenty of water    HPI:  Pt is a 85 y.o. male seen today for an acute visit for evaluation of constipation.He is here with daughter who states patient does not drink enough water.He states drinks coffee in the morning.He takes ex laxative but makes his stool too loose. He eats vegetables and fruits daily.Increase water intake and prune juice  discussed.    Past Medical History:  Diagnosis Date  . Hyperlipidemia    Past Surgical History:  Procedure Laterality Date  . HIP SURGERY Right 07/26/2016   [facility]    No Known Allergies  Outpatient Encounter Medications as of 09/20/2018  Medication Sig  . aspirin 81 MG EC tablet Take 81 mg by mouth daily. Swallow whole.  Marland Kitchen atorvastatin (LIPITOR) 40 MG tablet Take 0.5 tablets (20 mg total) by mouth daily. High Cholesterol  . Multiple Vitamin (ONE-A-DAY MENS PO) Take by mouth daily.  . NONFORMULARY OR COMPOUNDED ITEM [facility]   Achilles Tendonitis Cream- Diclofenac 3%, Baclofen 2%, Bupivacaine 1%, Doxepin 5%, Gabapentin 6%, Ibuprofen 3%, Pentoxifylline 3% Apply 1-2 grams to affected area 3-4 times daily Qty. 120 gm 3 refills  . sildenafil (VIAGRA) 100 MG tablet Take 1 tablet (100 mg total) by mouth daily as needed for erectile dysfunction. May take 0.25 tab daily instead of 1 tab  . docusate sodium (COLACE) 100 MG capsule Take 1 capsule (100 mg total) by mouth 2 (two) times daily.   No facility-administered encounter medications on file as of 09/20/2018.     Review of Systems  Constitutional: Negative for appetite change, chills and fever.  Respiratory: Negative for cough, chest tightness, shortness of breath and wheezing.   Cardiovascular: Negative for chest pain, palpitations and leg swelling.  Gastrointestinal: Positive for constipation. Negative for abdominal distention, abdominal pain, diarrhea, nausea and vomiting.  Endocrine: Negative for cold intolerance and heat intolerance.  Musculoskeletal: Negative for back pain and gait problem.  Skin: Negative for color change, pallor and rash.  Neurological: Negative for dizziness, light-headedness and headaches.  Psychiatric/Behavioral: Negative for agitation, confusion and sleep disturbance. The patient is not nervous/anxious.     Immunization History  Administered Date(s) Administered  . Influenza,inj,Quad PF,6+ Mos 05/25/2017  . Influenza-Unspecified 06/03/2016  . Td 09/14/2017   Pertinent  Health Maintenance Due  Topic Date Due  . PNA vac Low Risk Adult (2 of 2 - PPSV23) 06/18/2017  . INFLUENZA VACCINE  03/31/2018   Fall Risk  09/20/2018 02/01/2018 01/12/2018 09/14/2017 01/27/2017  Falls in the past year? 0 No No Yes Yes  Number falls in past yr: 0 - - 1 1  Injury with Fall? 0 - - Yes No  Comment - - - Skin tear left arm  -    Vitals:   09/20/18 1043  BP: 118/80  Pulse: 60  Temp: (!) 97.2 F (36.2 C)  TempSrc: Oral  SpO2: 98%  Weight: 143 lb  6.4 oz (65 kg)  Height: 5\' 5"  (1.651 m)   Body mass index is 23.86 kg/m. Physical Exam Vitals signs reviewed.  Constitutional:      General: He is not in acute distress.    Appearance: He is normal weight.  HENT:     Head: Normocephalic.     Mouth/Throat:     Mouth: Mucous membranes are moist.     Pharynx: Oropharynx is clear. No oropharyngeal exudate or posterior oropharyngeal erythema.  Eyes:     General: No scleral icterus.       Right eye: No discharge.        Left eye: No discharge.     Conjunctiva/sclera: Conjunctivae normal.     Pupils: Pupils are equal, round, and reactive to light.  Cardiovascular:     Rate and Rhythm: Normal rate and regular rhythm.     Pulses: Normal pulses.     Heart sounds: No murmur. No friction rub. No gallop.   Pulmonary:     Effort: Pulmonary effort is normal. No respiratory distress.     Breath sounds: Normal breath sounds. No wheezing or rales.  Abdominal:     General: Bowel sounds are normal. There is no distension.     Palpations: Abdomen is soft. There is no mass.     Tenderness: There is no abdominal tenderness. There is no right CVA tenderness, left CVA tenderness, guarding or rebound.  Skin:    General: Skin is warm and dry.     Coloration: Skin is not pale.     Findings: No erythema or rash.  Neurological:     Mental Status: He is alert and oriented to person, place, and time.     Gait: Gait normal.  Psychiatric:        Mood and Affect: Mood normal.        Speech: Speech normal.        Behavior: Behavior normal.        Thought Content: Thought content normal.        Judgment: Judgment normal.     Labs reviewed: Recent Labs    02/01/18 1055  NA 141  K 4.6  CL 103  CO2 32  GLUCOSE 84  BUN 20  CREATININE 0.84  CALCIUM 9.9   Recent Labs    02/01/18 1055  AST 20  ALT 22  BILITOT 0.6  PROT 6.7   Recent Labs    02/01/18 1055  WBC 8.5  NEUTROABS 5,321  HGB 14.7  HCT 41.8  MCV 91.1  PLT 244   Lab Results    Component Value Date   TSH 2.98 02/01/2018   No results found for: HGBA1C Lab Results  Component Value Date   CHOL 142 02/01/2018   HDL 53 02/01/2018   LDLCALC 74 02/01/2018   TRIG 73 02/01/2018   CHOLHDL 2.7 02/01/2018    Significant Diagnostic Results in last 30 days:  No results found.  Assessment/Plan   Slow transit constipation Negative abdomen exam.Encouraged to increase fluid intake.take Prune juice one glass daily.Take colace 100  mg capsule one by mouth daily as needed for constipation.Notify provider if no bowel movement x 3 days Drink 6-8 glasses of water daily.    Family/ staff Communication: Reviewed plan of care with patient.   Labs/tests ordered: None   Samaad Hashem C Adriauna Campton, NP

## 2018-10-20 ENCOUNTER — Encounter: Payer: Self-pay | Admitting: Family

## 2019-01-16 ENCOUNTER — Ambulatory Visit: Payer: Self-pay

## 2019-01-16 ENCOUNTER — Encounter: Payer: Medicare Other | Admitting: Family

## 2019-01-17 ENCOUNTER — Encounter: Payer: Medicare Other | Admitting: Family

## 2019-02-15 DIAGNOSIS — Z6825 Body mass index (BMI) 25.0-25.9, adult: Secondary | ICD-10-CM | POA: Diagnosis not present

## 2019-02-15 DIAGNOSIS — Z0000 Encounter for general adult medical examination without abnormal findings: Secondary | ICD-10-CM | POA: Diagnosis not present

## 2019-02-15 DIAGNOSIS — E7800 Pure hypercholesterolemia, unspecified: Secondary | ICD-10-CM | POA: Diagnosis not present

## 2019-02-15 DIAGNOSIS — Z79899 Other long term (current) drug therapy: Secondary | ICD-10-CM | POA: Diagnosis not present

## 2019-03-18 DIAGNOSIS — H2513 Age-related nuclear cataract, bilateral: Secondary | ICD-10-CM | POA: Diagnosis not present

## 2019-04-21 DIAGNOSIS — L821 Other seborrheic keratosis: Secondary | ICD-10-CM | POA: Diagnosis not present

## 2019-04-21 DIAGNOSIS — D225 Melanocytic nevi of trunk: Secondary | ICD-10-CM | POA: Diagnosis not present

## 2019-04-21 DIAGNOSIS — L570 Actinic keratosis: Secondary | ICD-10-CM | POA: Diagnosis not present

## 2019-05-03 ENCOUNTER — Other Ambulatory Visit: Payer: Self-pay

## 2019-05-03 DIAGNOSIS — R6889 Other general symptoms and signs: Secondary | ICD-10-CM | POA: Diagnosis not present

## 2019-05-03 DIAGNOSIS — Z20822 Contact with and (suspected) exposure to covid-19: Secondary | ICD-10-CM

## 2019-05-04 LAB — NOVEL CORONAVIRUS, NAA: SARS-CoV-2, NAA: NOT DETECTED

## 2019-05-20 DIAGNOSIS — Z23 Encounter for immunization: Secondary | ICD-10-CM | POA: Diagnosis not present

## 2019-09-23 ENCOUNTER — Ambulatory Visit: Payer: Medicare Other

## 2019-09-25 ENCOUNTER — Ambulatory Visit: Payer: Medicare Other | Attending: Internal Medicine

## 2019-09-25 DIAGNOSIS — Z23 Encounter for immunization: Secondary | ICD-10-CM | POA: Insufficient documentation

## 2019-09-25 NOTE — Progress Notes (Signed)
   Covid-19 Vaccination Clinic  Name:  Dalton Golden    MRN: [id number] DOB: Nov 11, 1928  09/25/2019  [name] was observed post Covid-19 immunization for 15 minutes without incidence. He was provided with Vaccine Information Sheet and instruction to access the V-Safe system.   Dalton Golden was instructed to call 911 with any severe reactions post vaccine: Marland Kitchen Difficulty breathing  . Swelling of your face and throat  . A fast heartbeat  . A bad rash all over your body  . Dizziness and weakness    Immunizations Administered    Name Date Dose VIS Date Route   Pfizer COVID-19 Vaccine 09/25/2019 12:46 PM 0.3 mL 08/11/2019 Intramuscular   Manufacturer: [place]   Lot: [id number]   [place]: [id number]

## 2019-10-16 ENCOUNTER — Ambulatory Visit: Payer: Medicare Other | Attending: Internal Medicine

## 2019-10-16 DIAGNOSIS — Z23 Encounter for immunization: Secondary | ICD-10-CM | POA: Insufficient documentation

## 2019-10-16 NOTE — Progress Notes (Signed)
   Covid-19 Vaccination Clinic  Name:  Dalton Golden    MRN: [id number] DOB: 1929-07-06  10/16/2019  [name] was observed post Covid-19 immunization for 15 minutes without incidence. He was provided with Vaccine Information Sheet and instruction to access the V-Safe system.   [name] was instructed to call 911 with any severe reactions post vaccine: Marland Kitchen Difficulty breathing  . Swelling of your face and throat  . A fast heartbeat  . A bad rash all over your body  . Dizziness and weakness    Immunizations Administered    Name Date Dose VIS Date Route   Pfizer COVID-19 Vaccine 10/16/2019 11:25 AM 0.3 mL 08/11/2019 Intramuscular   Manufacturer: [place]   Lot: [id number]   [place]: [id number]

## 2019-10-20 ENCOUNTER — Emergency Department (HOSPITAL_COMMUNITY)
Admission: EM | Admit: 2019-10-20 | Discharge: 2019-10-20 | Disposition: A | Discharge status: Home / Self Care | Payer: Medicare Other | Attending: Emergency Medicine | Admitting: Emergency Medicine

## 2019-10-20 ENCOUNTER — Other Ambulatory Visit: Payer: Self-pay

## 2019-10-20 ENCOUNTER — Encounter (HOSPITAL_COMMUNITY): Payer: Self-pay | Admitting: Emergency Medicine

## 2019-10-20 ENCOUNTER — Emergency Department (HOSPITAL_COMMUNITY): Payer: Medicare Other

## 2019-10-20 DIAGNOSIS — R110 Nausea: Secondary | ICD-10-CM | POA: Diagnosis not present

## 2019-10-20 DIAGNOSIS — R42 Dizziness and giddiness: Secondary | ICD-10-CM | POA: Diagnosis not present

## 2019-10-20 DIAGNOSIS — I959 Hypotension, unspecified: Secondary | ICD-10-CM | POA: Diagnosis not present

## 2019-10-20 DIAGNOSIS — Z87891 Personal history of nicotine dependence: Secondary | ICD-10-CM | POA: Diagnosis not present

## 2019-10-20 DIAGNOSIS — R55 Syncope and collapse: Secondary | ICD-10-CM | POA: Insufficient documentation

## 2019-10-20 DIAGNOSIS — R1111 Vomiting without nausea: Secondary | ICD-10-CM | POA: Diagnosis not present

## 2019-10-20 LAB — CBC WITH DIFFERENTIAL/PLATELET
Abs Immature Granulocytes: 0.07 10*3/uL (ref 0.00–0.07)
Basophils Absolute: 0 10*3/uL (ref 0.0–0.1)
Basophils Relative: 0 %
Eosinophils Absolute: 0 10*3/uL (ref 0.0–0.5)
Eosinophils Relative: 0 %
HCT: 44.9 % (ref 39.0–52.0)
Hemoglobin: 14.9 g/dL (ref 13.0–17.0)
Immature Granulocytes: 0 %
Lymphocytes Relative: 6 %
Lymphs Abs: 1 10*3/uL (ref 0.7–4.0)
MCH: 32 pg (ref 26.0–34.0)
MCHC: 33.2 g/dL (ref 30.0–36.0)
MCV: 96.6 fL (ref 80.0–100.0)
Monocytes Absolute: 0.7 10*3/uL (ref 0.1–1.0)
Monocytes Relative: 4 %
Neutro Abs: 14.8 10*3/uL — ABNORMAL HIGH (ref 1.7–7.7)
Neutrophils Relative %: 90 %
Platelets: 188 10*3/uL (ref 150–400)
RBC: 4.65 MIL/uL (ref 4.22–5.81)
RDW: 13.2 % (ref 11.5–15.5)
WBC: 16.6 10*3/uL — ABNORMAL HIGH (ref 4.0–10.5)
nRBC: 0 % (ref 0.0–0.2)

## 2019-10-20 LAB — COMPREHENSIVE METABOLIC PANEL
ALT: 48 U/L — ABNORMAL HIGH (ref 0–44)
AST: 32 U/L (ref 15–41)
Albumin: 4 g/dL (ref 3.5–5.0)
Alkaline Phosphatase: 51 U/L (ref 38–126)
Anion gap: 10 (ref 5–15)
BUN: 20 mg/dL (ref 8–23)
CO2: 28 mmol/L (ref 22–32)
Calcium: 9.2 mg/dL (ref 8.9–10.3)
Chloride: 102 mmol/L (ref 98–111)
Creatinine, Ser: 0.86 mg/dL (ref 0.61–1.24)
GFR calc Af Amer: >60 mL/min (ref >60)
GFR calc non Af Amer: >60 mL/min (ref >60)
Glucose, Bld: 161 mg/dL — ABNORMAL HIGH (ref 70–99)
Potassium: 4.3 mmol/L (ref 3.5–5.1)
Sodium: 140 mmol/L (ref 135–145)
Total Bilirubin: 0.9 mg/dL (ref 0.3–1.2)
Total Protein: 6.9 g/dL (ref 6.5–8.1)

## 2019-10-20 LAB — URINALYSIS, ROUTINE W REFLEX MICROSCOPIC
Bilirubin Urine: NEGATIVE
Glucose, UA: NEGATIVE mg/dL
Hgb urine dipstick: NEGATIVE
Ketones, ur: 5 mg/dL — AB
Leukocytes,Ua: NEGATIVE
Nitrite: NEGATIVE
Protein, ur: NEGATIVE mg/dL
Specific Gravity, Urine: 1.015 (ref 1.005–1.030)
pH: 7 (ref 5.0–8.0)

## 2019-10-20 LAB — TROPONIN I (HIGH SENSITIVITY)
Troponin I (High Sensitivity): <2 ng/L (ref <18)
Troponin I (High Sensitivity): 3 ng/L (ref <18)

## 2019-10-20 LAB — LIPASE, BLOOD: Lipase: 24 U/L (ref 11–51)

## 2019-10-20 LAB — CBG MONITORING, ED: Glucose-Capillary: 134 mg/dL — ABNORMAL HIGH (ref 70–99)

## 2019-10-20 MED ORDER — SODIUM CHLORIDE 0.9 % IV BOLUS
500.0000 mL | Freq: Once | INTRAVENOUS | Status: AC
  Administered 2019-10-20: 13:00:00 500 mL via INTRAVENOUS

## 2019-10-20 NOTE — ED Notes (Signed)
Patient was given ginger ale. Patient has tolerated without any complaints or difficulties.

## 2019-10-20 NOTE — ED Notes (Signed)
Sharl Ma, son, [phone] would like an update on his father.

## 2019-10-20 NOTE — ED Provider Notes (Signed)
[facility] DEPT Provider Note   CSN: [id number] Arrival date & time: 10/20/19  1101     History Chief Complaint  Patient presents with  . Near Syncope    Dalton Golden is a 86 y.o. male with a past medical history of  hyperlipidemia, who presents today for evaluation of a near syncopal event. He reports that he ate breakfast at home and after that started feeling lightheaded. He states that he was able to go to the bathroom and he vomited continuously for about 10 to 15 minutes.  After that he was helped by his sons to get back into bed.  They reportedly called primary care doctor who told him to go to the emergency room. He denies any pain in his chest or shortness of breath at this time.  He did have 1 slightly loose bowel movement this morning.  He states that he ate total cereal and half a banana neither of which are new for him. He currently denies any pain, specifically no pain in his head, neck, chest abdomen or back. He did not have a full syncopal event.  Reports that he got his second Covid shot last Monday, denies any significant side effects from that. He denies feeling like things were spinning or moving.  He has never had a problem like this before. His symptoms improved after he received Zofran in route by EMS.    HPI     Past Medical History:  Diagnosis Date  . Hyperlipidemia     Patient Active Problem List   Diagnosis Date Noted  . Rash and nonspecific skin eruption 02/01/2018  . Enlarged prostate on rectal examination 02/01/2018  . High risk medication use 02/01/2018  . Hyperglycemia 02/01/2018  . Erectile dysfunction 09/15/2017  . Overgrown toenails 12/10/2016  . Closed displaced fracture of right femoral neck ([place]) 10/13/2016  . Mixed hyperlipidemia 06/24/2016  . Heel spur, unspecified laterality 06/24/2016    Past Surgical History:  Procedure Laterality Date  . HIP SURGERY Right 07/26/2016   [facility]       Family History  Problem Relation Age of Onset  . Heart attack Brother   . Heart disease Brother     Social History   Tobacco Use  . Smoking status: Former Smoker    Packs/day: 1.00    Years: 25.00    Pack years: 25.00    Types: Cigarettes    Quit date: 08/31/1958    Years since quitting: 61.1  . Smokeless tobacco: Never Used  Substance Use Topics  . Alcohol use: Yes    Comment: 1 drink every night  . Drug use: No    Home Medications Prior to Admission medications   Medication Sig Start Date End Date Taking? Authorizing Provider  atorvastatin (LIPITOR) 40 MG tablet Take 0.5 tablets (20 mg total) by mouth daily. High Cholesterol 02/01/18  Yes Gildardo Cranker, DO  Multiple Vitamin (ONE-A-DAY MENS PO) Take by mouth daily.   Yes [provider]  sildenafil (VIAGRA) 100 MG tablet Take 1 tablet (100 mg total) by mouth daily as needed for erectile dysfunction. May take 0.25 tab daily instead of 1 tab 09/14/17  Yes Eulas Post, [place], DO  docusate sodium (COLACE) 100 MG capsule Take 1 capsule (100 mg total) by mouth 2 (two) times daily. Patient not taking: Reported on 10/20/2019 09/20/18   Ngetich, Nelda Bucks, NP    Allergies    Patient has no known allergies.  Review of Systems  Review of Systems  Constitutional: Negative for chills and fever.  HENT: Negative for congestion.   Eyes: Negative for visual disturbance.  Respiratory: Negative for cough, chest tightness and shortness of breath.   Cardiovascular: Negative for chest pain and palpitations.  Gastrointestinal: Positive for nausea and vomiting. Negative for abdominal pain and constipation.  Genitourinary: Negative for dysuria.  Musculoskeletal: Negative for back pain and neck pain.  Skin: Negative for color change, rash and wound.  Neurological: Positive for light-headedness. Negative for syncope, facial asymmetry, speech difficulty, weakness, numbness and headaches.       Near syncope    Psychiatric/Behavioral: Negative for confusion.  All other systems reviewed and are negative.   Physical Exam Updated Vital Signs BP 137/69 (BP Location: Right Arm)   Pulse 62   Temp 98 F (36.7 C) (Oral)   Resp 15   Ht 5\' 5"  (1.651 m)   Wt 61.2 kg   SpO2 98%   BMI 22.47 kg/m   Physical Exam Vitals and nursing note reviewed.  Constitutional:      General: He is not in acute distress.    Appearance: He is well-developed. He is not diaphoretic.  HENT:     Head: Normocephalic and atraumatic.  Eyes:     General: No scleral icterus.       Right eye: No discharge.        Left eye: No discharge.     Conjunctiva/sclera: Conjunctivae normal.     Comments: Pupils are asymmetrical however are reactive to light bilaterally.  Left pupil is more constricted than right.  EOM intact without nystagmus.  Cardiovascular:     Rate and Rhythm: Normal rate and regular rhythm.     Pulses: Normal pulses.     Heart sounds: Normal heart sounds.  Pulmonary:     Effort: Pulmonary effort is normal. No respiratory distress.     Breath sounds: No stridor.  Abdominal:     General: Abdomen is flat. Bowel sounds are normal.     Palpations: Abdomen is soft.     Tenderness: There is no abdominal tenderness. There is no guarding.  Musculoskeletal:        General: No deformity.     Cervical back: Normal range of motion and neck supple.     Right lower leg: No edema.     Left lower leg: No edema.  Skin:    General: Skin is warm and dry.  Neurological:     General: No focal deficit present.     Mental Status: He is alert and oriented to person, place, and time.     Cranial Nerves: No cranial nerve deficit.     Motor: No abnormal muscle tone.  Psychiatric:        Mood and Affect: Mood normal.        Behavior: Behavior normal.     ED Results / Procedures / Treatments   Labs (all labs ordered are listed, but only abnormal results are displayed) Labs Reviewed  CBC WITH DIFFERENTIAL/PLATELET -  Abnormal; Notable for the following components:      Result Value   WBC 16.6 (*)    Neutro Abs 14.8 (*)    All other components within normal limits  COMPREHENSIVE METABOLIC PANEL - Abnormal; Notable for the following components:   Glucose, Bld 161 (*)    ALT 48 (*)    All other components within normal limits  URINALYSIS, ROUTINE W REFLEX MICROSCOPIC - Abnormal; Notable for the following components:  APPearance CLOUDY (*)    Ketones, ur 5 (*)    All other components within normal limits  CBG MONITORING, ED - Abnormal; Notable for the following components:   Glucose-Capillary 134 (*)    All other components within normal limits  LIPASE, BLOOD  TROPONIN I (HIGH SENSITIVITY)  TROPONIN I (HIGH SENSITIVITY)    EKG EKG Interpretation  Date/Time:  Friday October 20 2019 11:13:23 EST Ventricular Rate:  62 PR Interval:    QRS Duration: 110 QT Interval:  466 QTC Calculation: 474 R Axis:   -40 Text Interpretation: Sinus rhythm Prolonged PR interval Left axis deviation Probable anteroseptal infarct, old Borderline T abnormalities, inferior leads Confirmed by Virgel Manifold [id number]) on 10/20/2019 12:07:38 PM   Radiology DG Chest 2 View  Result Date: 10/20/2019 CLINICAL DATA:  Near syncope. EXAM: CHEST - 2 VIEW COMPARISON:  No prior. FINDINGS: Mediastinum hilar structures normal. Lungs are clear of acute infiltrates. No pleural effusion or pneumothorax. Mild elevation left hemidiaphragm. Degenerative change thoracic spine. Mild carotid vascular calcification. IMPRESSION: No acute cardiopulmonary disease. Mild elevation left hemidiaphragm. Electronically Signed   By: Marcello Moores  Register   On: 10/20/2019 12:06    Procedures Procedures (including critical care time)  Medications Ordered in ED Medications  sodium chloride 0.9 % bolus 500 mL (0 mLs Intravenous Stopped 10/20/19 1544)    ED Course  I have reviewed the triage vital signs and the nursing notes.  Pertinent labs & imaging  results that were available during my care of the patient were reviewed by me and considered in my medical decision making (see chart for details).  Clinical Course as of Oct 19 2144  Fri Oct 20, 2019  1448 Spoke with patient's son who is his healthcare power of attorney.  We discussed results.  Plan.  Son is in agreement with plan and close outpatient follow-up.   [EH]  1518 Patient re-evaluated, abd is soft, non tender, he reports feeling better.  Discussed plan.     [EH]    Clinical Course User Index [EH] Lorin Glass, PA-C   Orthostatic VS for the past 24 hrs:  BP- Lying Pulse- Lying BP- Sitting Pulse- Sitting BP- Standing at 0 minutes Pulse- Standing at 0 minutes  10/20/19 1137 142/65 63 154/77 64 143/73 76     MDM Rules/Calculators/A&P                     Patient is an 86 year old gentleman who presents today for evaluation of a near syncopal event with vomiting that occurred this morning prior to arrival. On exam his abdomen is soft, nontender nondistended.  He is not currently complaining of any pain in his chest or abdomen.  He was treated with Zofran in route which resolved his reported nausea and vomiting. EKG was obtained showing no evidence of acute ischemia.. Labs are obtained and reviewed, white count is elevated at 16.6 however this may be reactive as he does not have any clear evidence of infection at this time.  CMP and urine are unremarkable.  Lipase is not elevated.  Troponin x2 is negative.  Patient is not having any chest pain or symptoms at this time.  He was able to p.o. challenge without difficulty.  He is not significantly orthostatic on review of orthostatic vitals. I spoke with both patient and his son who are agreeable for discharge as patient is currently asymptomatic.   He did not have a full syncopal event with loss of consciousness and as  he is not symptomatic at this time no indication for admission.  Return precautions were discussed with patient  who states their understanding.  At the time of discharge patient denied any unaddressed complaints or concerns.  Patient is agreeable for discharge home.  Note: Portions of this report may have been transcribed using voice recognition software. Every effort was made to ensure accuracy; however, inadvertent computerized transcription errors may be present  This patient was seen as a shared visit with Dr. Wilson Singer.   Final Clinical Impression(s) / ED Diagnoses Final diagnoses:  Near syncope    Rx / DC Orders ED Discharge Orders    None       Ollen Gross 10/20/19 2147    Virgel Manifold, MD 10/24/19 [phone]

## 2019-10-20 NOTE — Discharge Instructions (Signed)
Please schedule a follow-up appointment with your primary care doctor in the next week. If your symptoms return, you develop any new/concerning symptoms including chest pain, shortness of breath, headache, fevers or feeling lightheaded please return to the emergency room. Today your blood work was reassuring.

## 2019-10-20 NOTE — ED Triage Notes (Signed)
Patient arrived by EMS from home.   Patient c/o near syncopal episode after eating breakfast. Patient reports dizziness and N/V.   Patient denies LOC.

## 2019-10-25 DIAGNOSIS — R5383 Other fatigue: Secondary | ICD-10-CM | POA: Diagnosis not present

## 2019-10-25 DIAGNOSIS — Z6824 Body mass index (BMI) 24.0-24.9, adult: Secondary | ICD-10-CM | POA: Diagnosis not present

## 2019-10-25 DIAGNOSIS — R9389 Abnormal findings on diagnostic imaging of other specified body structures: Secondary | ICD-10-CM | POA: Diagnosis not present

## 2019-10-25 DIAGNOSIS — R739 Hyperglycemia, unspecified: Secondary | ICD-10-CM | POA: Diagnosis not present

## 2019-10-25 DIAGNOSIS — R55 Syncope and collapse: Secondary | ICD-10-CM | POA: Diagnosis not present

## 2019-10-25 DIAGNOSIS — D72829 Elevated white blood cell count, unspecified: Secondary | ICD-10-CM | POA: Diagnosis not present

## 2019-10-25 DIAGNOSIS — A084 Viral intestinal infection, unspecified: Secondary | ICD-10-CM | POA: Diagnosis not present

## 2019-10-25 DIAGNOSIS — K5900 Constipation, unspecified: Secondary | ICD-10-CM | POA: Diagnosis not present

## 2019-10-27 ENCOUNTER — Other Ambulatory Visit: Payer: Self-pay | Admitting: Family Medicine

## 2019-10-27 DIAGNOSIS — R9389 Abnormal findings on diagnostic imaging of other specified body structures: Secondary | ICD-10-CM

## 2019-10-31 ENCOUNTER — Ambulatory Visit
Admission: RE | Admit: 2019-10-31 | Discharge: 2019-10-31 | Disposition: A | Discharge status: Home / Self Care | Payer: Medicare Other | Source: Ambulatory Visit | Attending: Family Medicine | Admitting: Family Medicine

## 2019-10-31 DIAGNOSIS — R9389 Abnormal findings on diagnostic imaging of other specified body structures: Secondary | ICD-10-CM

## 2019-10-31 DIAGNOSIS — I6523 Occlusion and stenosis of bilateral carotid arteries: Secondary | ICD-10-CM | POA: Diagnosis not present

## 2020-02-13 DIAGNOSIS — R350 Frequency of micturition: Secondary | ICD-10-CM | POA: Diagnosis not present

## 2020-03-12 DIAGNOSIS — S7001XA Contusion of right hip, initial encounter: Secondary | ICD-10-CM | POA: Diagnosis not present

## 2020-03-12 DIAGNOSIS — S40011A Contusion of right shoulder, initial encounter: Secondary | ICD-10-CM | POA: Diagnosis not present

## 2020-03-12 DIAGNOSIS — Z96641 Presence of right artificial hip joint: Secondary | ICD-10-CM | POA: Diagnosis not present

## 2020-03-22 ENCOUNTER — Ambulatory Visit: Payer: Medicare Other | Admitting: Nurse Practitioner

## 2020-04-15 DIAGNOSIS — H26491 Other secondary cataract, right eye: Secondary | ICD-10-CM | POA: Diagnosis not present

## 2020-04-19 DIAGNOSIS — H26492 Other secondary cataract, left eye: Secondary | ICD-10-CM | POA: Diagnosis not present

## 2020-04-19 DIAGNOSIS — H26491 Other secondary cataract, right eye: Secondary | ICD-10-CM | POA: Diagnosis not present

## 2020-05-07 ENCOUNTER — Other Ambulatory Visit: Payer: Medicare Other

## 2020-05-07 ENCOUNTER — Other Ambulatory Visit: Payer: Self-pay

## 2020-05-07 DIAGNOSIS — Z20822 Contact with and (suspected) exposure to covid-19: Secondary | ICD-10-CM

## 2020-05-08 LAB — SARS-COV-2, NAA 2 DAY TAT

## 2020-05-08 LAB — NOVEL CORONAVIRUS, NAA: SARS-CoV-2, NAA: NOT DETECTED

## 2020-05-26 DIAGNOSIS — Z23 Encounter for immunization: Secondary | ICD-10-CM | POA: Diagnosis not present

## 2020-05-29 DIAGNOSIS — L821 Other seborrheic keratosis: Secondary | ICD-10-CM | POA: Diagnosis not present

## 2020-05-29 DIAGNOSIS — L820 Inflamed seborrheic keratosis: Secondary | ICD-10-CM | POA: Diagnosis not present

## 2020-05-29 DIAGNOSIS — L570 Actinic keratosis: Secondary | ICD-10-CM | POA: Diagnosis not present

## 2020-05-29 DIAGNOSIS — D225 Melanocytic nevi of trunk: Secondary | ICD-10-CM | POA: Diagnosis not present

## 2020-05-29 DIAGNOSIS — Z85828 Personal history of other malignant neoplasm of skin: Secondary | ICD-10-CM | POA: Diagnosis not present

## 2020-05-29 DIAGNOSIS — D1801 Hemangioma of skin and subcutaneous tissue: Secondary | ICD-10-CM | POA: Diagnosis not present

## 2020-05-29 DIAGNOSIS — L814 Other melanin hyperpigmentation: Secondary | ICD-10-CM | POA: Diagnosis not present

## 2020-05-29 DIAGNOSIS — D692 Other nonthrombocytopenic purpura: Secondary | ICD-10-CM | POA: Diagnosis not present

## 2020-06-14 DIAGNOSIS — Z23 Encounter for immunization: Secondary | ICD-10-CM | POA: Diagnosis not present

## 2020-07-05 DIAGNOSIS — Z638 Other specified problems related to primary support group: Secondary | ICD-10-CM | POA: Diagnosis not present

## 2020-07-05 DIAGNOSIS — Z6824 Body mass index (BMI) 24.0-24.9, adult: Secondary | ICD-10-CM | POA: Diagnosis not present

## 2020-11-06 DIAGNOSIS — B078 Other viral warts: Secondary | ICD-10-CM | POA: Diagnosis not present

## 2020-11-06 DIAGNOSIS — Z85828 Personal history of other malignant neoplasm of skin: Secondary | ICD-10-CM | POA: Diagnosis not present

## 2020-12-06 DIAGNOSIS — Z23 Encounter for immunization: Secondary | ICD-10-CM | POA: Diagnosis not present

## 2021-02-26 DIAGNOSIS — E559 Vitamin D deficiency, unspecified: Secondary | ICD-10-CM | POA: Diagnosis not present

## 2021-02-26 DIAGNOSIS — R3981 Functional urinary incontinence: Secondary | ICD-10-CM | POA: Diagnosis not present

## 2021-02-26 DIAGNOSIS — Z79899 Other long term (current) drug therapy: Secondary | ICD-10-CM | POA: Diagnosis not present

## 2021-02-26 DIAGNOSIS — Z0000 Encounter for general adult medical examination without abnormal findings: Secondary | ICD-10-CM | POA: Diagnosis not present

## 2021-02-26 DIAGNOSIS — Z6824 Body mass index (BMI) 24.0-24.9, adult: Secondary | ICD-10-CM | POA: Diagnosis not present

## 2021-02-26 DIAGNOSIS — E7800 Pure hypercholesterolemia, unspecified: Secondary | ICD-10-CM | POA: Diagnosis not present

## 2021-02-26 DIAGNOSIS — R7303 Prediabetes: Secondary | ICD-10-CM | POA: Diagnosis not present

## 2021-04-14 IMAGING — CR DG CHEST 2V
2 series · 2 of 2 positions shown · non-contrast
Comparison: No prior.

CLINICAL DATA: Near syncope.

EXAM:
CHEST - 2 VIEW

[w chest pa]
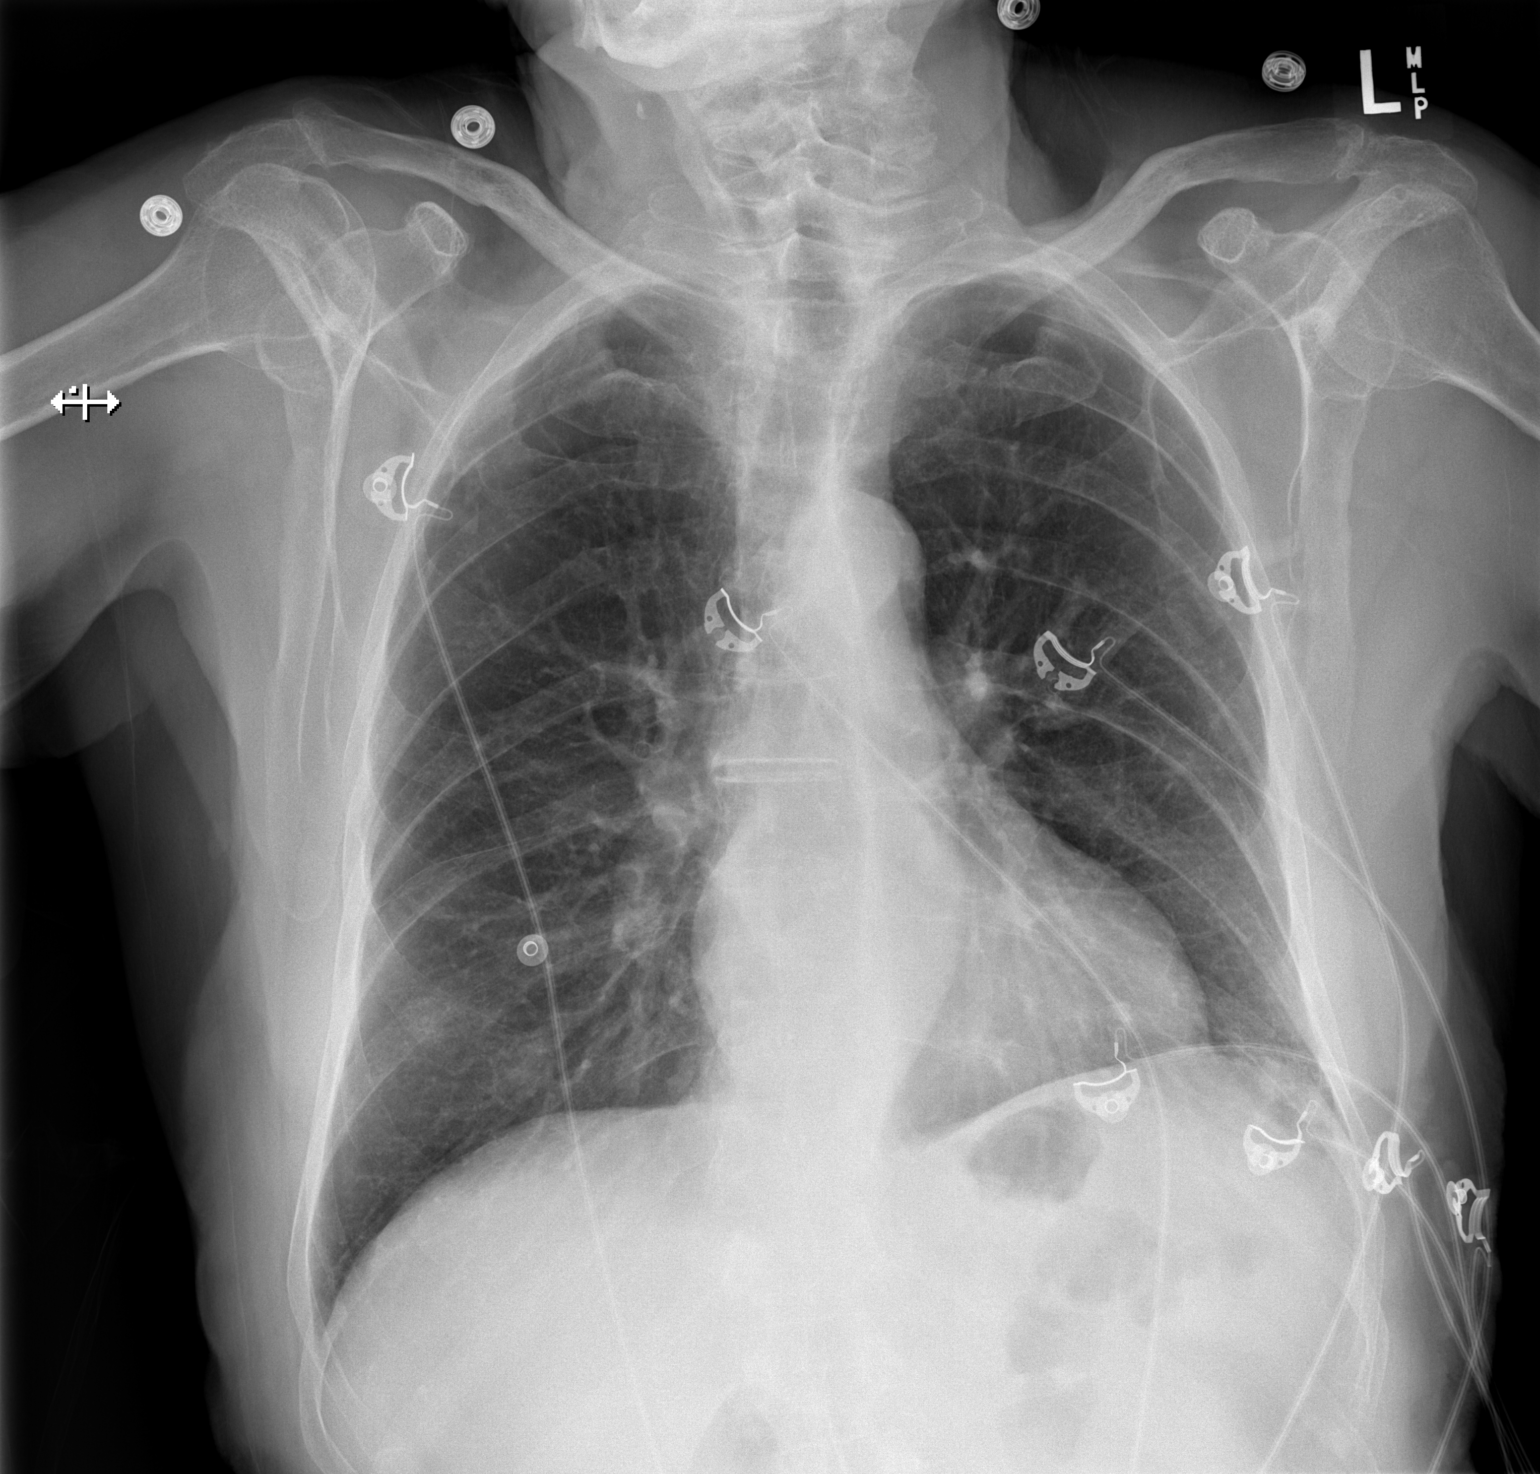

[w chest lat]
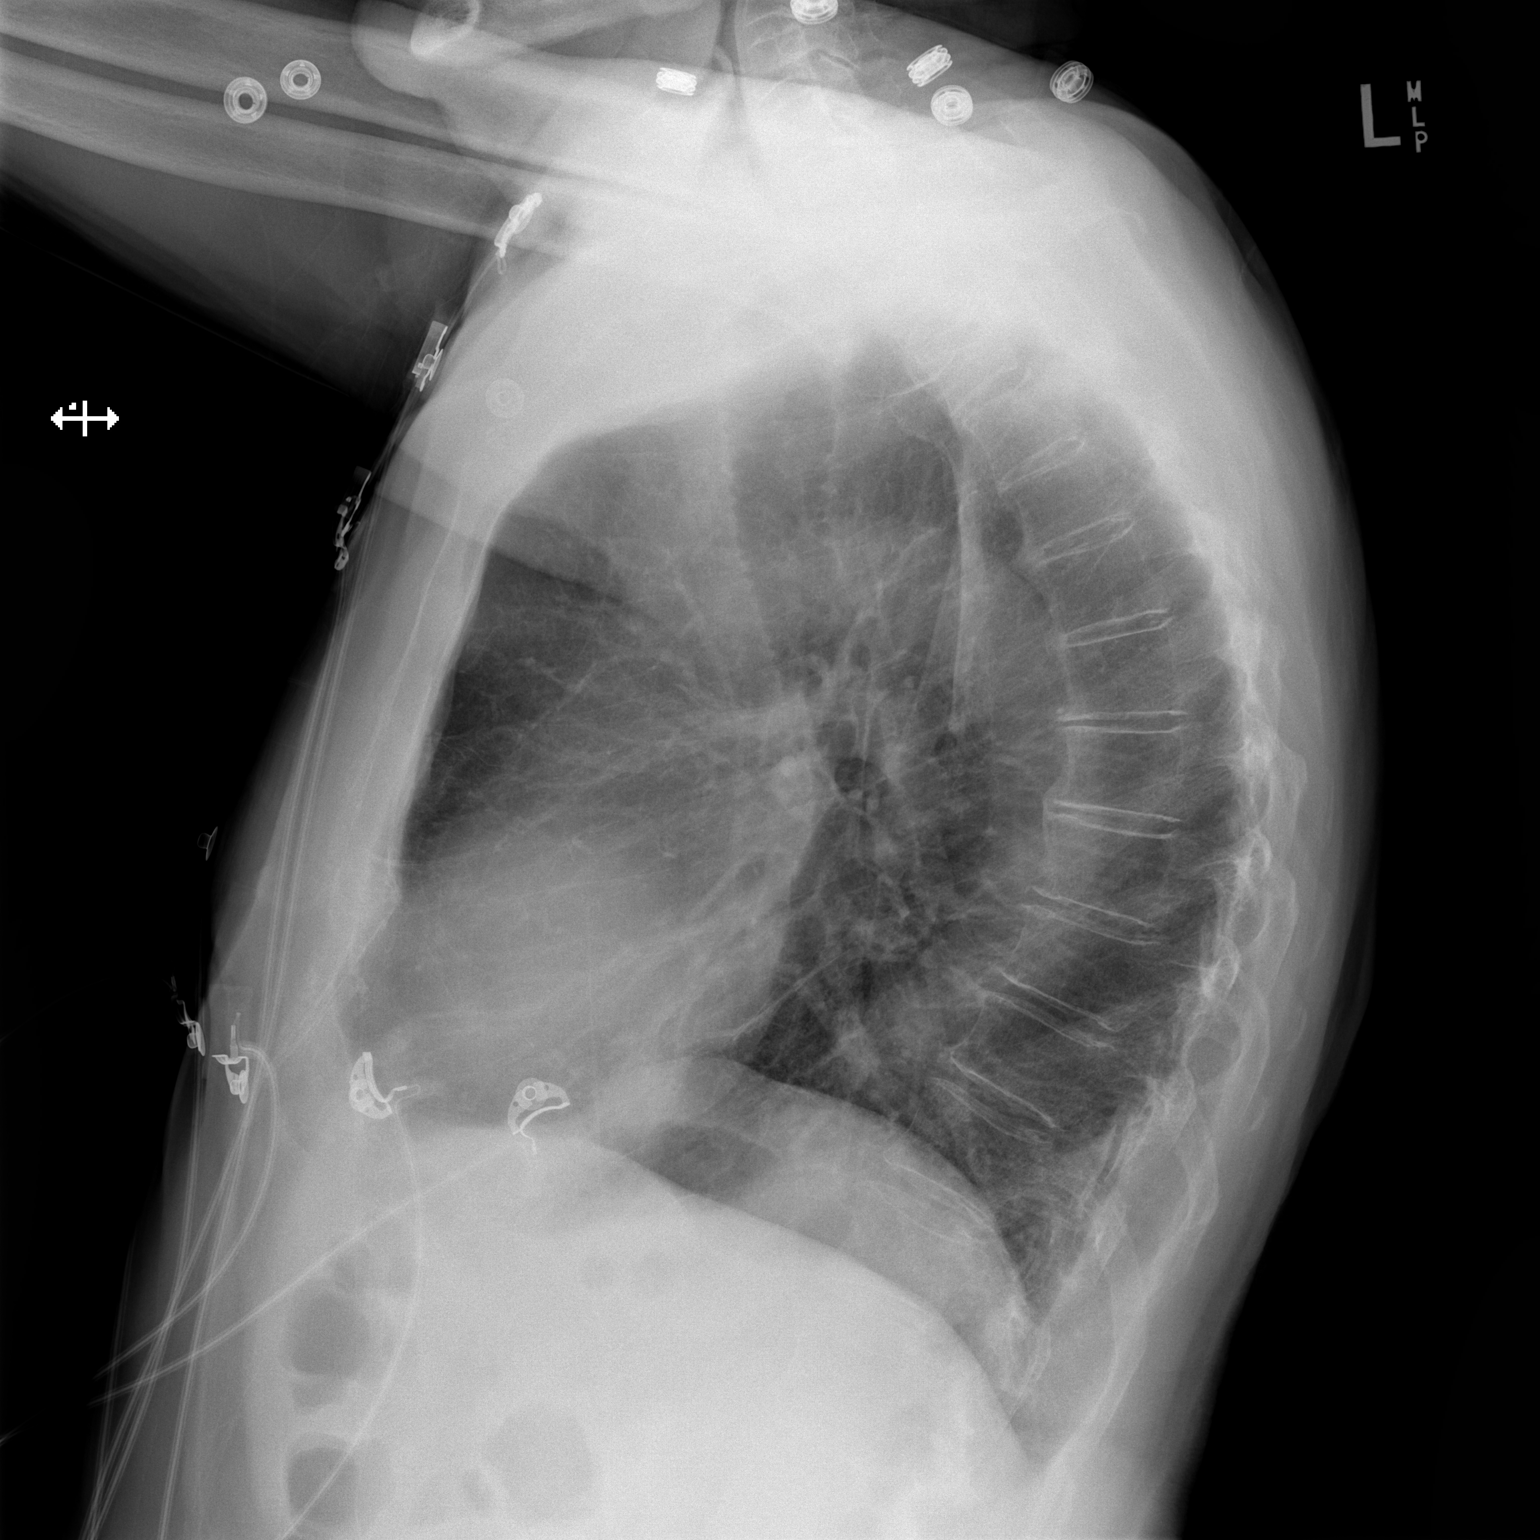

[2 of 2 positions shown; findings below may reference images not displayed]

FINDINGS: Mediastinum hilar structures normal. Lungs are clear of acute
infiltrates. No pleural effusion or pneumothorax. Mild elevation
left hemidiaphragm. Degenerative change thoracic spine. Mild carotid
vascular calcification.
IMPRESSION: No acute cardiopulmonary disease. Mild elevation left hemidiaphragm.

## 2021-04-25 IMAGING — US US CAROTID DUPLEX BILAT
1 series · 13 of 24 positions shown · non-contrast
Comparison: None.

CLINICAL DATA: Carotid calcifications on chest radiograph

EXAM:
BILATERAL CAROTID DUPLEX ULTRASOUND
TECHNIQUE: Gray scale imaging, color Doppler and duplex ultrasound were
performed of bilateral carotid and vertebral arteries in the neck.

[Series 1: us carotid duplex bilat · 0.05mm/px · 13 of 95 slices shown]
[im 1/95]
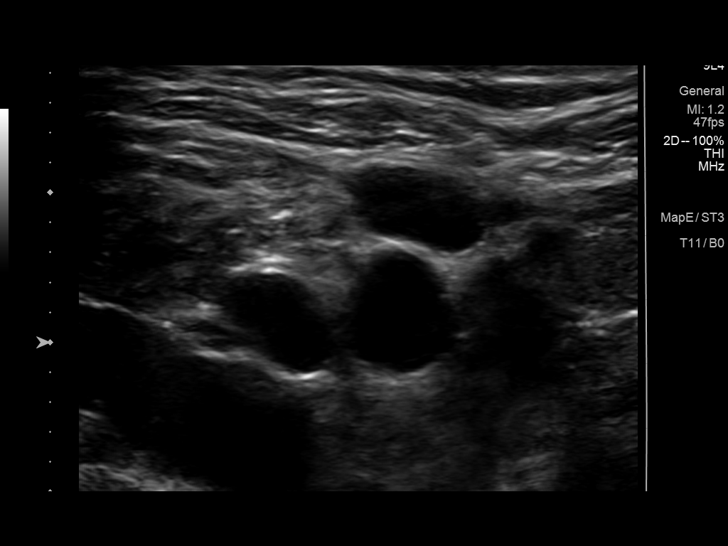
[im 9/95]
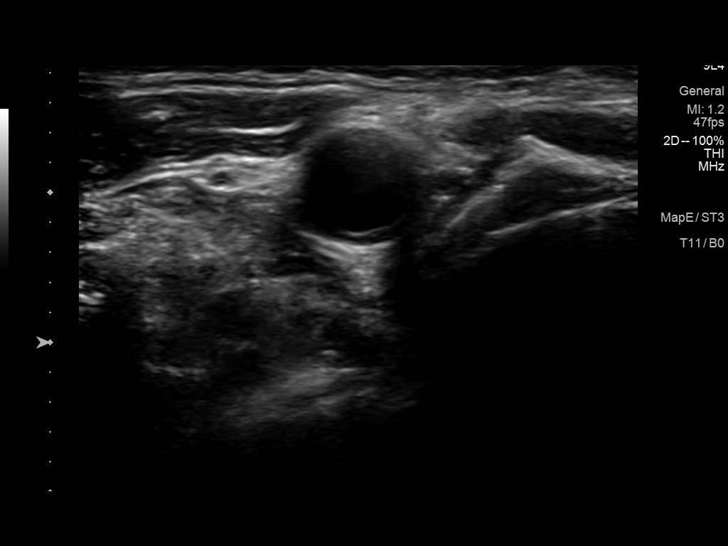
[im 17/95]
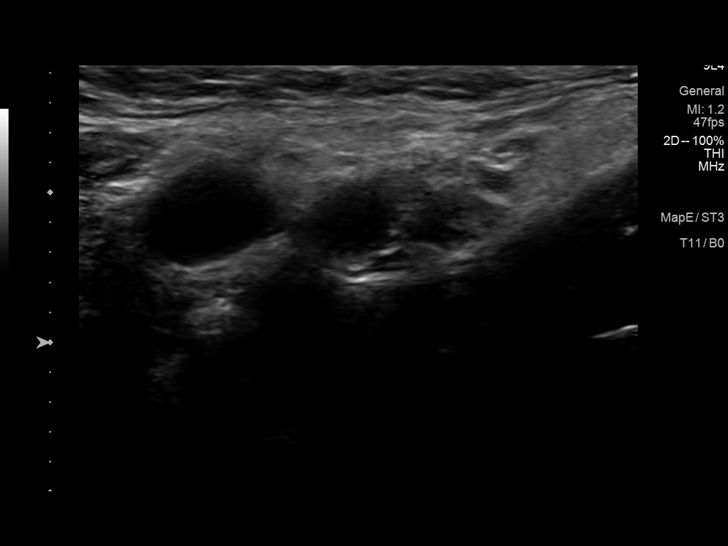
[im 25/95]
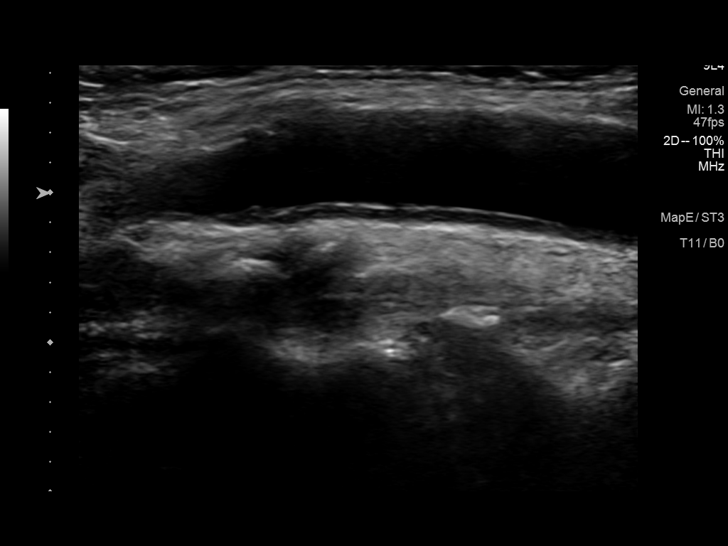
[im 33/95]
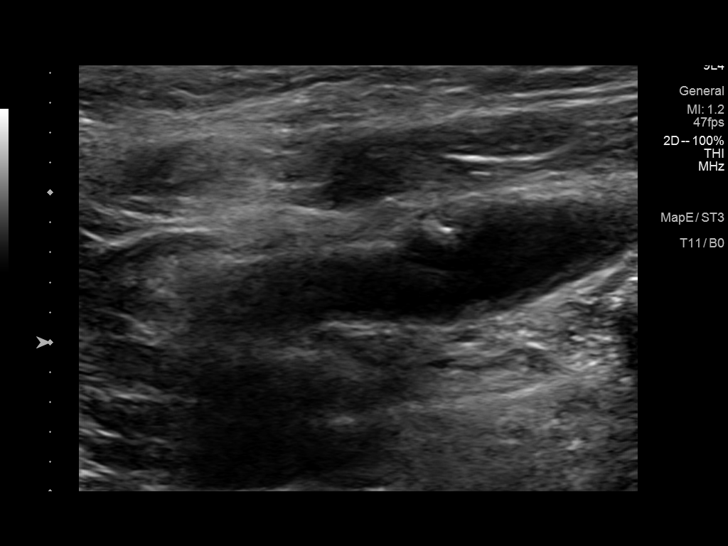
[im 41/95]
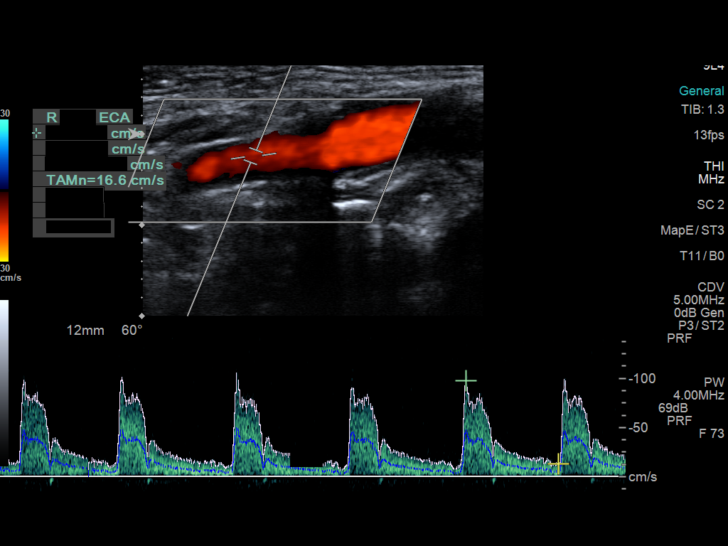
[im 50/95]
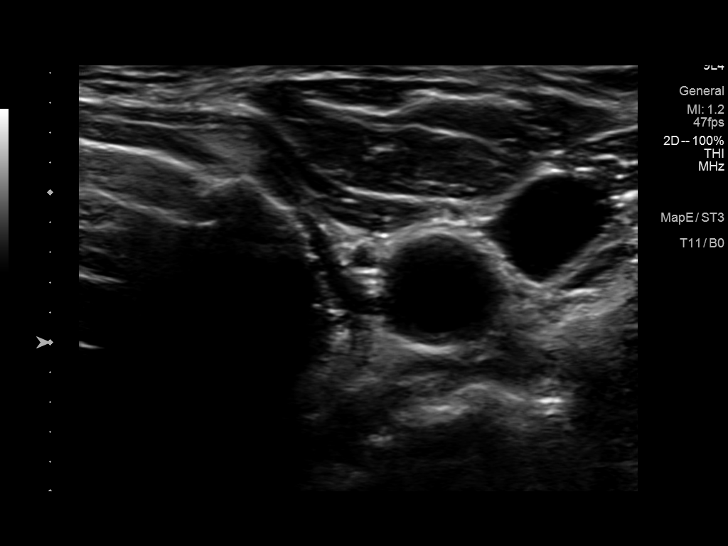
[im 54/95]
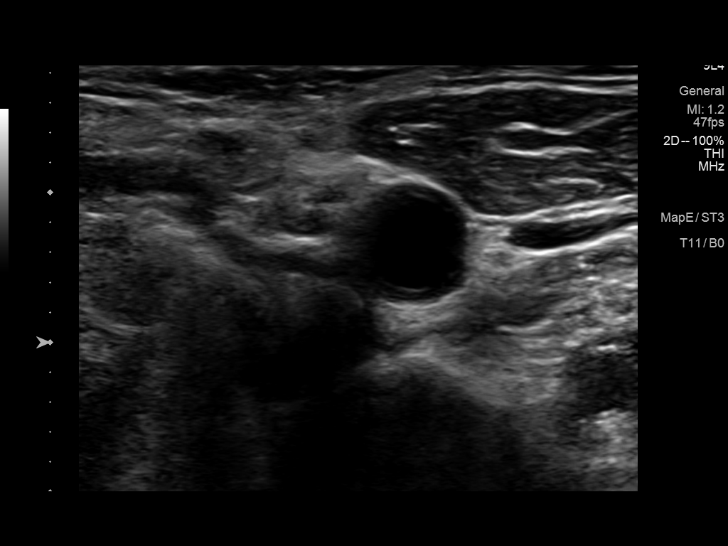
[im 62/95]
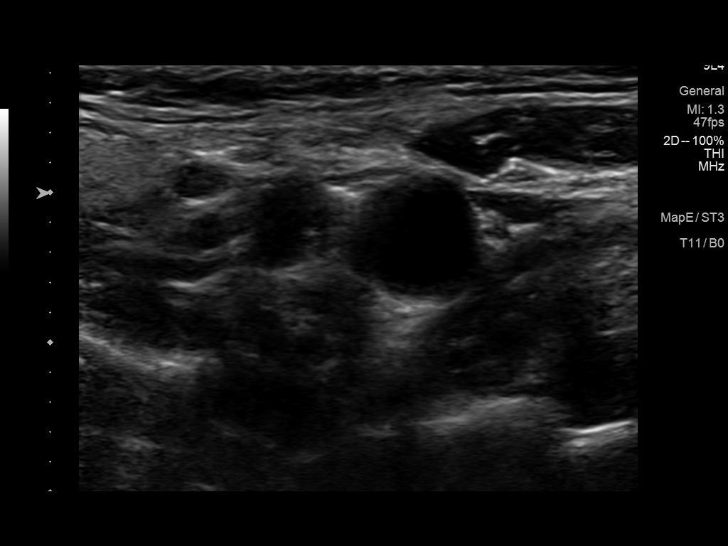
[im 70/95]
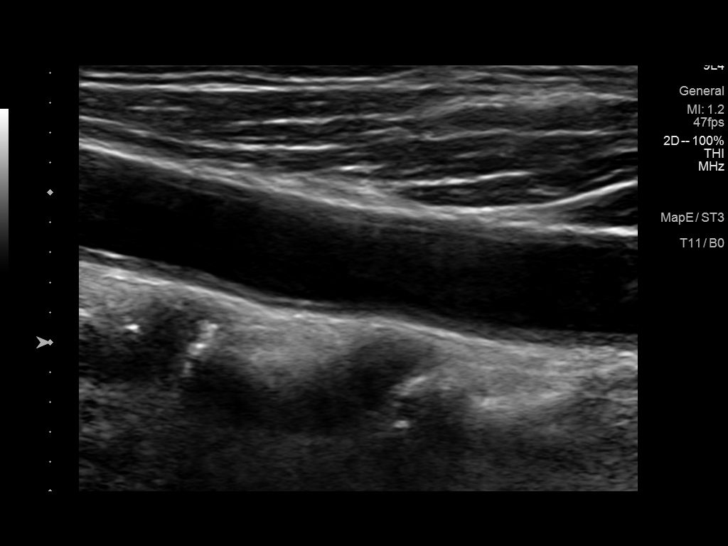
[im 78/95]
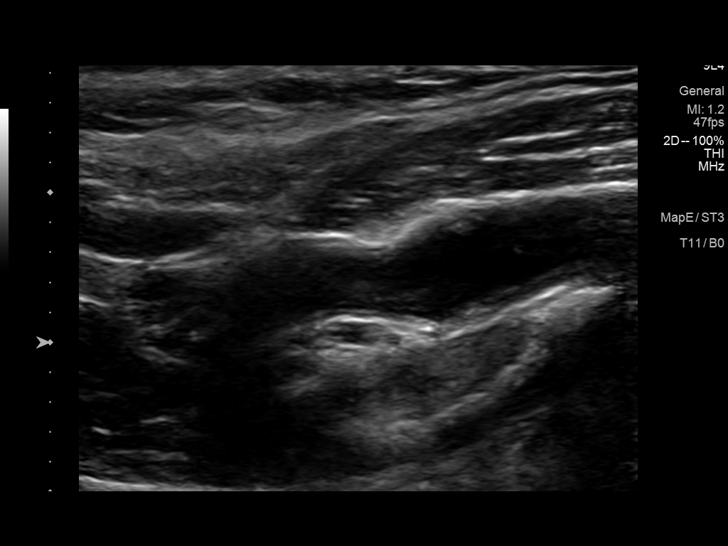
[im 86/95]
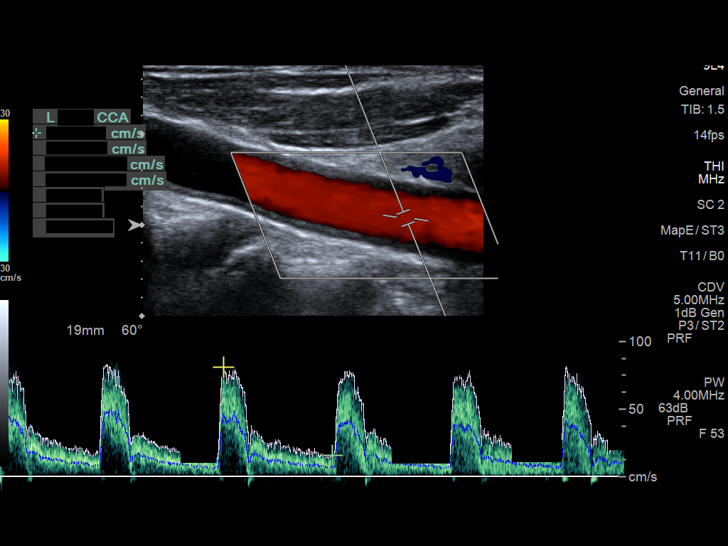
[im 95/95]
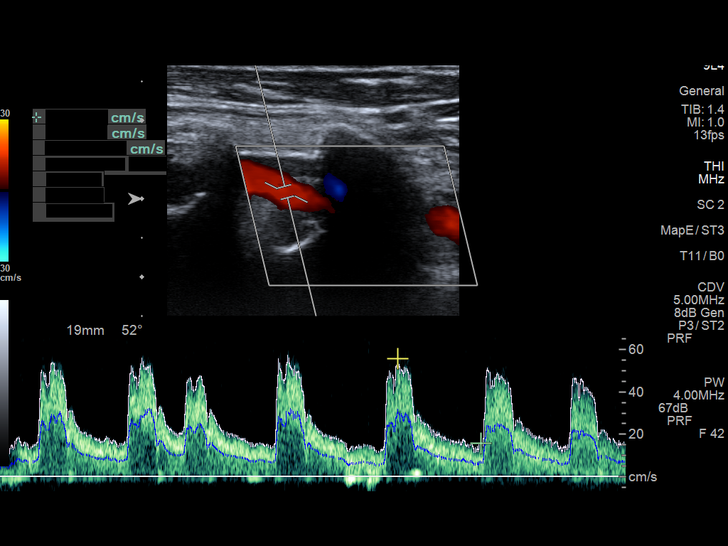

[13 of 24 positions shown; findings below may reference images not displayed]

FINDINGS: Criteria: Quantification of carotid stenosis is based on velocity
parameters that correlate the residual internal carotid diameter
with NASCET-based stenosis levels, using the diameter of the distal
internal carotid lumen as the denominator for stenosis measurement.

The following velocity measurements were obtained:

RIGHT

ICA: 95/24 cm/sec

CCA: 115/20 cm/sec

SYSTOLIC ICA/CCA RATIO:

ECA: 98 cm/sec

LEFT

ICA: 109/27 cm/sec

CCA: 104/21 cm/sec

SYSTOLIC ICA/CCA RATIO:

ECA: 91 cm/sec

RIGHT CAROTID ARTERY: Mild intimal thickening in the common carotid.
Partially calcified plaque in the bulb. No high-grade stenosis.
Normal waveforms and color Doppler signal. Mild tortuosity.

RIGHT VERTEBRAL ARTERY:  Normal flow direction and waveform.

LEFT CAROTID ARTERY: Eccentric partially calcified plaque in the
bulb and proximal ICA. No high-grade stenosis. Normal waveforms and
color Doppler signal.

LEFT VERTEBRAL ARTERY:  Normal flow direction and waveform.

Upper extremity blood pressures: RIGHT: 151/57 LEFT: 140/70
IMPRESSION: 1. Bilateral carotid bifurcation plaque resulting in less than 50%
diameter ICA stenosis.
2. Antegrade bilateral vertebral arterial flow.

## 2021-05-19 DIAGNOSIS — Z23 Encounter for immunization: Secondary | ICD-10-CM | POA: Diagnosis not present

## 2021-06-02 DIAGNOSIS — Z85828 Personal history of other malignant neoplasm of skin: Secondary | ICD-10-CM | POA: Diagnosis not present

## 2021-06-02 DIAGNOSIS — C44219 Basal cell carcinoma of skin of left ear and external auricular canal: Secondary | ICD-10-CM | POA: Diagnosis not present

## 2021-06-02 DIAGNOSIS — L570 Actinic keratosis: Secondary | ICD-10-CM | POA: Diagnosis not present

## 2021-06-02 DIAGNOSIS — L821 Other seborrheic keratosis: Secondary | ICD-10-CM | POA: Diagnosis not present

## 2021-06-02 DIAGNOSIS — L814 Other melanin hyperpigmentation: Secondary | ICD-10-CM | POA: Diagnosis not present

## 2021-06-10 DIAGNOSIS — Z23 Encounter for immunization: Secondary | ICD-10-CM | POA: Diagnosis not present

## 2021-09-18 ENCOUNTER — Ambulatory Visit (INDEPENDENT_AMBULATORY_CARE_PROVIDER_SITE_OTHER): Payer: Medicare Other | Admitting: Orthopedic Surgery

## 2021-09-18 ENCOUNTER — Encounter: Payer: Self-pay | Admitting: Orthopedic Surgery

## 2021-09-18 ENCOUNTER — Other Ambulatory Visit: Payer: Self-pay

## 2021-09-18 VITALS — BP 122/76 | HR 73 | Temp 96.9°F | Ht 63.0 in | Wt 140.3 lb

## 2021-09-18 DIAGNOSIS — E782 Mixed hyperlipidemia: Secondary | ICD-10-CM

## 2021-09-18 DIAGNOSIS — E559 Vitamin D deficiency, unspecified: Secondary | ICD-10-CM | POA: Diagnosis not present

## 2021-09-18 DIAGNOSIS — R7303 Prediabetes: Secondary | ICD-10-CM

## 2021-09-18 DIAGNOSIS — R350 Frequency of micturition: Secondary | ICD-10-CM | POA: Diagnosis not present

## 2021-09-18 DIAGNOSIS — G8929 Other chronic pain: Secondary | ICD-10-CM | POA: Diagnosis not present

## 2021-09-18 DIAGNOSIS — M25551 Pain in right hip: Secondary | ICD-10-CM | POA: Diagnosis not present

## 2021-09-18 DIAGNOSIS — Z85828 Personal history of other malignant neoplasm of skin: Secondary | ICD-10-CM

## 2021-09-18 DIAGNOSIS — R5383 Other fatigue: Secondary | ICD-10-CM | POA: Diagnosis not present

## 2021-09-18 MED ORDER — ATORVASTATIN CALCIUM 40 MG PO TABS: 20.0000 mg | ORAL_TABLET | Freq: Every day | ORAL | 3 refills | Status: DC

## 2021-09-18 NOTE — Progress Notes (Signed)
Careteam: Patient Care Team: Yvonna Alanis, NP as PCP - General (Adult Health Nurse Practitioner)  Seen by: Windell Moulding, AGNP-C  PLACE OF SERVICE:  [facility] Directive information    No Known Allergies  No chief complaint on file.    HPI: Patient is a 88 y.o. male seen today to establish at [facility].   Previous provider-Dr. Kathyrn Lass at [facility]. Medical records requested. He wanted provider that focused more on geriatrics. Moved to [place] 4 years ago. Married 25 years, wife passed away 7 years ago. Has a new girlfriend and lives with her. 2 children. 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 Lives in single story house- 20 steps to basement.   High cholesterol- diagnosed in 70's, he has been taking Lipitor for about 20 years, admits to liking cheese and seafood's but limits them  Prediabetes- he has never been told he is diabetic, grandparents had diabetes  Vitamin D deficiency- does not take vitamin D, diagnosis listed from previous provider, admits to being fatigued more easily   Urinary incontinence- frequent urination, nocturia 1-2 times a night  Skin cancer- last spot on left ear, removed by dermatology  Right hip pain- right femoral neck fracture 2017, right leg sometimes painful, no recent falls, does not use assistive device to ambulate  No recent hospitalizations of injuries  Surgeries:  Right hip surgery 2017- right femoral neck fracture  Past procedures:  Carotid doppler- 2021  Whole body scan 09/01/2021  Family history:  Father- deceased age 65- old age  Mother- deceased age 56- old age  54- alive age 25- diabetes, memory loss, heart failure  Brother- deceased age 23's- heart attack  Vaccinations:  Copy requested from [place]   Up to date on flu and shingles vaccine  Smoking: past smoker- 1/2 PPD x 10 years in 92's- quit 1960's Alcohol: 3 drinks per week, no past abuse Drugs: never used, no past abuse  Diet: drinks on cup coffee  daily, no soda, diet high in fruits,vegetables and lean meats Exercise: attends [employer] 2x/week, also walks or does strength training 35-40 minutes daily at home   Advanced directives complete, copy requested       Review of Systems:  Review of Systems  Constitutional:  Negative for chills, fever, malaise/fatigue and weight loss.  HENT:  Negative for hearing loss and sore throat.   Eyes:  Negative for blurred vision and double vision.  Respiratory:  Negative for cough, shortness of breath and wheezing.   Cardiovascular:  Negative for chest pain and leg swelling.  Gastrointestinal:  Negative for abdominal pain, blood in stool, constipation, diarrhea, heartburn, nausea and vomiting.  Genitourinary:  Positive for frequency. Negative for dysuria and hematuria.  Musculoskeletal:  Positive for joint pain. Negative for falls.  Skin: Negative.   Neurological:  Negative for dizziness, weakness and headaches.  Psychiatric/Behavioral:  Negative for depression and memory loss. The patient is not nervous/anxious and does not have insomnia.    Past Medical History:  Diagnosis Date   Hyperlipidemia    Past Surgical History:  Procedure Laterality Date   HIP SURGERY Right 07/26/2016   [facility]   Social History:   reports that he quit smoking about 63 years ago. His smoking use included cigarettes. He has a 25.00 pack-year smoking history. He has never used smokeless tobacco. He reports current alcohol use. He reports that he does not use drugs.  Family History  Problem Relation Age of Onset   Heart attack  Brother    Heart disease Brother     Medications: Patient's Medications  New Prescriptions   No medications on file  Previous Medications   ATORVASTATIN (LIPITOR) 40 MG TABLET    Take 0.5 tablets (20 mg total) by mouth daily. High Cholesterol   DOCUSATE SODIUM (COLACE) 100 MG CAPSULE    Take 1 capsule (100 mg total) by mouth 2 (two) times daily.   MULTIPLE VITAMIN  (ONE-A-DAY MENS PO)    Take by mouth daily.   SILDENAFIL (VIAGRA) 100 MG TABLET    Take 1 tablet (100 mg total) by mouth daily as needed for erectile dysfunction. May take 0.25 tab daily instead of 1 tab  Modified Medications   No medications on file  Discontinued Medications   No medications on file    Physical Exam:  There were no vitals filed for this visit. There is no height or weight on file to calculate BMI. Wt Readings from Last 3 Encounters:  10/20/19 135 lb (61.2 kg)  09/20/18 143 lb 6.4 oz (65 kg)  02/01/18 139 lb 9.6 oz (63.3 kg)    Physical Exam Vitals reviewed.  Constitutional:      General: He is not in acute distress. HENT:     Head: Normocephalic.  Eyes:     General:        Right eye: No discharge.        Left eye: No discharge.  Neck:     Vascular: No carotid bruit.  Cardiovascular:     Rate and Rhythm: Normal rate and regular rhythm.     Pulses: Normal pulses.     Heart sounds: Normal heart sounds. No murmur heard. Pulmonary:     Effort: Pulmonary effort is normal. No respiratory distress.     Breath sounds: Normal breath sounds. No wheezing.  Abdominal:     General: Bowel sounds are normal. There is no distension.     Palpations: Abdomen is soft.     Tenderness: There is no abdominal tenderness.  Musculoskeletal:     Cervical back: Normal range of motion.     Right lower leg: No edema.     Left lower leg: No edema.  Lymphadenopathy:     Cervical: No cervical adenopathy.  Skin:    General: Skin is warm and dry.     Capillary Refill: Capillary refill takes less than 2 seconds.  Neurological:     General: No focal deficit present.     Mental Status: He is alert and oriented to person, place, and time.     Motor: No weakness.     Gait: Gait normal.  Psychiatric:        Mood and Affect: Mood normal.        Behavior: Behavior normal.    Labs reviewed: Basic Metabolic Panel: No results for input(s): NA, K, CL, CO2, GLUCOSE, BUN, CREATININE,  CALCIUM, MG, PHOS, TSH in the last 8760 hours. Liver Function Tests: No results for input(s): AST, ALT, ALKPHOS, BILITOT, PROT, ALBUMIN in the last 8760 hours. No results for input(s): LIPASE, AMYLASE in the last 8760 hours. No results for input(s): AMMONIA in the last 8760 hours. CBC: No results for input(s): WBC, NEUTROABS, HGB, HCT, MCV, PLT in the last 8760 hours. Lipid Panel: No results for input(s): CHOL, HDL, LDLCALC, TRIG, CHOLHDL, LDLDIRECT in the last 8760 hours. TSH: No results for input(s): TSH in the last 8760 hours. A1C: No results found for: HGBA1C   Assessment/Plan 1. Mixed hyperlipidemia -  LDL unknown- labs requested - cont statin - cont diet low in fats and fried foods - atorvastatin (LIPITOR) 40 MG tablet; Take 0.5 tablets (20 mg total) by mouth daily. High Cholesterol  Dispense: 45 tablet; Refill: 3 - lipid panel- future  2. Prediabetes - elevated glucose/A1c in past - cont limit foods in sugar and carbs - A1c- future  3. Vitamin D deficiency - labs requested - not on supplement at this time - cont multivitamin - encourage activity outside  4. Urinary frequency - abnormal prostate exam in past - going 1-2x/night - may consider urology consult/flomax in future  5. Chronic right hip pain - hx femoral neck fracture 2017 - ambulates on his own - no recent falls - recommend tylenol for pain  6. History of skin cancer - followed by dermatology  7. Fatigue, unspecified - very active for his age - lately more fatigued - cbc/diff- future - cmp- future - TSH- future  Total time: 45 minutes. Greater than 50% of total time spent doing patient education on health maintenance and symptom management regarding urinary frequency.   Future labs/ tests: cbc/diff, cmp, TSH, lipid panel, A1c, MMSE   Next appt: 10/16/2021  Windell Moulding, [facility] [phone]

## 2021-09-18 NOTE — Patient Instructions (Addendum)
Vitamin D- 2000 units daily  Please bring living will so we can make copy

## 2021-10-16 ENCOUNTER — Other Ambulatory Visit: Payer: Self-pay

## 2021-10-16 ENCOUNTER — Encounter: Payer: Self-pay | Admitting: Orthopedic Surgery

## 2021-10-16 ENCOUNTER — Ambulatory Visit (INDEPENDENT_AMBULATORY_CARE_PROVIDER_SITE_OTHER): Payer: Medicare Other | Admitting: Orthopedic Surgery

## 2021-10-16 VITALS — BP 140/82 | HR 62 | Temp 97.1°F | Ht 64.0 in | Wt 138.2 lb

## 2021-10-16 DIAGNOSIS — R5383 Other fatigue: Secondary | ICD-10-CM

## 2021-10-16 DIAGNOSIS — R7303 Prediabetes: Secondary | ICD-10-CM | POA: Diagnosis not present

## 2021-10-16 DIAGNOSIS — E782 Mixed hyperlipidemia: Secondary | ICD-10-CM | POA: Diagnosis not present

## 2021-10-16 DIAGNOSIS — R739 Hyperglycemia, unspecified: Secondary | ICD-10-CM | POA: Diagnosis not present

## 2021-10-16 DIAGNOSIS — R4189 Other symptoms and signs involving cognitive functions and awareness: Secondary | ICD-10-CM | POA: Diagnosis not present

## 2021-10-16 DIAGNOSIS — R350 Frequency of micturition: Secondary | ICD-10-CM

## 2021-10-16 NOTE — Progress Notes (Signed)
Careteam: Patient Care Team: Yvonna Alanis, NP as PCP - General (Adult Health Nurse Practitioner)  Seen by: Windell Moulding, AGNP-C  PLACE OF SERVICE:  [place] Directive information Does Patient Have a Medical Advance Directive?: Yes, Type of Advance Directive: [place], Does patient want to make changes to medical advance directive?: No - Patient declined  No Known Allergies  Chief Complaint  Patient presents with   Medical Management of Chronic Issues    4 week follow up. Discuss the need for Shingrix and Pneumonia vaccine, or post pone if patient refuses.     HPI: Patient is a 88 y.o. male seen today for medical management of chronic conditions.   He is not fasting for lab work today.   MMSE 23/30, sentence complete, correct shapes and clock.   No health concerns today.   He is trying to plan a vacation with family.   Continues to attend [employer] a few times weekly to exercise.   Review of Systems:  Review of Systems  Constitutional:  Positive for malaise/fatigue. Negative for chills, fever and weight loss.  HENT:  Negative for congestion and sore throat.   Eyes:  Negative for blurred vision and double vision.  Respiratory:  Negative for cough, shortness of breath and wheezing.   Cardiovascular:  Negative for chest pain and leg swelling.  Gastrointestinal:  Negative for abdominal pain, blood in stool, constipation, diarrhea, heartburn, nausea and vomiting.  Genitourinary:  Positive for frequency. Negative for dysuria and hematuria.  Musculoskeletal:  Positive for joint pain. Negative for falls.  Skin:  Negative for itching and rash.  Neurological:  Negative for dizziness, weakness and headaches.  Psychiatric/Behavioral:  Negative for depression and memory loss. The patient is not nervous/anxious and does not have insomnia.    Past Medical History:  Diagnosis Date   Hyperlipidemia    Past Surgical History:  Procedure Laterality Date    HIP SURGERY Right 07/26/2016   [facility]   Social History:   reports that he quit smoking about 63 years ago. His smoking use included cigarettes. He has a 25.00 pack-year smoking history. He has never used smokeless tobacco. He reports current alcohol use. He reports that he does not use drugs.  Family History  Problem Relation Age of Onset   Heart attack Brother    Dementia Brother    Heart attack Brother    Heart disease Brother     Medications: Patient's Medications  New Prescriptions   No medications on file  Previous Medications   ATORVASTATIN (LIPITOR) 40 MG TABLET    Take 0.5 tablets (20 mg total) by mouth daily. High Cholesterol   MULTIPLE VITAMIN (ONE-A-DAY MENS PO)    Take by mouth daily.  Modified Medications   No medications on file  Discontinued Medications   No medications on file    Physical Exam:  Vitals:   10/16/21 1257  BP: 140/82  Pulse: 62  Temp: (!) 97.1 F (36.2 C)  SpO2: 97%  Weight: 138 lb 3.2 oz (62.7 kg)  Height: 5\' 4"  (1.626 m)   Body mass index is 23.72 kg/m. Wt Readings from Last 3 Encounters:  10/16/21 138 lb 3.2 oz (62.7 kg)  09/18/21 140 lb 4.8 oz (63.6 kg)  10/20/19 135 lb (61.2 kg)    Physical Exam Vitals reviewed.  Constitutional:      General: He is not in acute distress. HENT:     Head: Normocephalic.  Eyes:  General:        Right eye: No discharge.        Left eye: No discharge.  Neck:     Vascular: No carotid bruit.  Cardiovascular:     Rate and Rhythm: Normal rate and regular rhythm.     Pulses: Normal pulses.     Heart sounds: Normal heart sounds. No murmur heard. Pulmonary:     Effort: Pulmonary effort is normal. No respiratory distress.     Breath sounds: Normal breath sounds. No wheezing.  Abdominal:     General: Bowel sounds are normal. There is no distension.     Palpations: Abdomen is soft.     Tenderness: There is no abdominal tenderness.  Musculoskeletal:     Cervical back: Neck  supple.     Right lower leg: No edema.     Left lower leg: No edema.  Lymphadenopathy:     Cervical: No cervical adenopathy.  Skin:    General: Skin is warm and dry.     Capillary Refill: Capillary refill takes less than 2 seconds.  Neurological:     General: No focal deficit present.     Mental Status: He is alert and oriented to person, place, and time.     Motor: No weakness.     Gait: Gait normal.  Psychiatric:        Mood and Affect: Mood normal.        Behavior: Behavior normal.    Labs reviewed: Basic Metabolic Panel: No results for input(s): NA, K, CL, CO2, GLUCOSE, BUN, CREATININE, CALCIUM, MG, PHOS, TSH in the last 8760 hours. Liver Function Tests: No results for input(s): AST, ALT, ALKPHOS, BILITOT, PROT, ALBUMIN in the last 8760 hours. No results for input(s): LIPASE, AMYLASE in the last 8760 hours. No results for input(s): AMMONIA in the last 8760 hours. CBC: No results for input(s): WBC, NEUTROABS, HGB, HCT, MCV, PLT in the last 8760 hours. Lipid Panel: No results for input(s): CHOL, HDL, LDLCALC, TRIG, CHOLHDL, LDLDIRECT in the last 8760 hours. TSH: No results for input(s): TSH in the last 8760 hours. A1C: No results found for: HGBA1C   Assessment/Plan 1. Fatigue, unspecified type - napping more often - exam unremarkable - TSH - CMP - CBC with Differential/Platelet  2. Prediabetes -  cont limit foods in sugar and carbs - Hemoglobin A1c  3. Urinary frequency - no changes  - averages 1-2x/night  4. Mixed hyperlipidemia - not fasting today - lipid panel- future - cont to limit foods in fat and fried foods  5. Cognitive impairment - MMSE 23/30, correct sentence/shapes/clock  Total time: 31 minutes. Greater than 50% of total time spent doing patient education regarding heath maintenance, diet and exercise.    Next appt: none Aleta Manternach [place], [facility] [phone]

## 2021-10-17 LAB — CBC WITH DIFFERENTIAL/PLATELET
Absolute Monocytes: 902 cells/uL (ref 200–950)
Basophils Absolute: 38 cells/uL (ref 0–200)
Basophils Relative: 0.4 %
Eosinophils Absolute: 106 cells/uL (ref 15–500)
Eosinophils Relative: 1.1 %
HCT: 45.4 % (ref 38.5–50.0)
Hemoglobin: 15 g/dL (ref 13.2–17.1)
Lymphs Abs: 2064 cells/uL (ref 850–3900)
MCH: 31.4 pg (ref 27.0–33.0)
MCHC: 33 g/dL (ref 32.0–36.0)
MCV: 95 fL (ref 80.0–100.0)
MPV: 9.6 fL (ref 7.5–12.5)
Monocytes Relative: 9.4 %
Neutro Abs: 6490 cells/uL (ref 1500–7800)
Neutrophils Relative %: 67.6 %
Platelets: 231 10*3/uL (ref 140–400)
RBC: 4.78 10*6/uL (ref 4.20–5.80)
RDW: 12.4 % (ref 11.0–15.0)
Total Lymphocyte: 21.5 %
WBC: 9.6 10*3/uL (ref 3.8–10.8)

## 2021-10-17 LAB — COMPREHENSIVE METABOLIC PANEL
AG Ratio: 1.9 (calc) (ref 1.0–2.5)
ALT: 23 U/L (ref 9–46)
AST: 22 U/L (ref 10–35)
Albumin: 4.5 g/dL (ref 3.6–5.1)
Alkaline phosphatase (APISO): 55 U/L (ref 35–144)
BUN: 19 mg/dL (ref 7–25)
CO2: 32 mmol/L (ref 20–32)
Calcium: 9.6 mg/dL (ref 8.6–10.3)
Chloride: 102 mmol/L (ref 98–110)
Creat: 0.81 mg/dL (ref 0.70–1.22)
Globulin: 2.4 g/dL (calc) (ref 1.9–3.7)
Glucose, Bld: 89 mg/dL (ref 65–139)
Potassium: 4.2 mmol/L (ref 3.5–5.3)
Sodium: 142 mmol/L (ref 135–146)
Total Bilirubin: 0.5 mg/dL (ref 0.2–1.2)
Total Protein: 6.9 g/dL (ref 6.1–8.1)

## 2021-10-17 LAB — HEMOGLOBIN A1C
Hgb A1c MFr Bld: 6.2 % of total Hgb — ABNORMAL HIGH (ref <5.7)
Mean Plasma Glucose: 131 mg/dL
eAG (mmol/L): 7.3 mmol/L

## 2021-10-17 LAB — TSH: TSH: 2.37 mIU/L (ref 0.40–4.50)

## 2021-12-22 DIAGNOSIS — D485 Neoplasm of uncertain behavior of skin: Secondary | ICD-10-CM | POA: Diagnosis not present

## 2021-12-22 DIAGNOSIS — Z85828 Personal history of other malignant neoplasm of skin: Secondary | ICD-10-CM | POA: Diagnosis not present

## 2021-12-22 DIAGNOSIS — L821 Other seborrheic keratosis: Secondary | ICD-10-CM | POA: Diagnosis not present

## 2021-12-22 DIAGNOSIS — L720 Epidermal cyst: Secondary | ICD-10-CM | POA: Diagnosis not present

## 2021-12-22 DIAGNOSIS — B078 Other viral warts: Secondary | ICD-10-CM | POA: Diagnosis not present

## 2021-12-22 DIAGNOSIS — L820 Inflamed seborrheic keratosis: Secondary | ICD-10-CM | POA: Diagnosis not present

## 2022-01-08 ENCOUNTER — Other Ambulatory Visit: Payer: Self-pay | Admitting: Orthopedic Surgery

## 2022-01-08 DIAGNOSIS — E782 Mixed hyperlipidemia: Secondary | ICD-10-CM

## 2022-04-20 ENCOUNTER — Other Ambulatory Visit: Payer: Medicare Other

## 2022-04-20 DIAGNOSIS — E782 Mixed hyperlipidemia: Secondary | ICD-10-CM

## 2022-04-21 LAB — LIPID PANEL
Cholesterol: 159 mg/dL (ref <200)
HDL: 59 mg/dL (ref >=40)
LDL Cholesterol (Calc): 85 mg/dL (calc)
Non-HDL Cholesterol (Calc): 100 mg/dL (calc) (ref <130)
Total CHOL/HDL Ratio: 2.7 (calc) (ref <5.0)
Triglycerides: 68 mg/dL (ref <150)

## 2022-04-23 ENCOUNTER — Encounter: Payer: Medicare Other | Admitting: Orthopedic Surgery

## 2022-04-24 NOTE — Progress Notes (Signed)
This encounter was created in error - please disregard.

## 2022-04-30 ENCOUNTER — Encounter: Payer: Self-pay | Admitting: Orthopedic Surgery

## 2022-04-30 ENCOUNTER — Ambulatory Visit (INDEPENDENT_AMBULATORY_CARE_PROVIDER_SITE_OTHER): Payer: Medicare Other | Admitting: Orthopedic Surgery

## 2022-04-30 ENCOUNTER — Other Ambulatory Visit: Payer: Self-pay

## 2022-04-30 VITALS — BP 122/68 | HR 60 | Temp 97.1°F | Resp 20 | Ht 64.0 in | Wt 134.8 lb

## 2022-04-30 DIAGNOSIS — R5383 Other fatigue: Secondary | ICD-10-CM

## 2022-04-30 DIAGNOSIS — E782 Mixed hyperlipidemia: Secondary | ICD-10-CM | POA: Diagnosis not present

## 2022-04-30 DIAGNOSIS — R7303 Prediabetes: Secondary | ICD-10-CM

## 2022-04-30 DIAGNOSIS — R4189 Other symptoms and signs involving cognitive functions and awareness: Secondary | ICD-10-CM | POA: Diagnosis not present

## 2022-04-30 MED ORDER — ATORVASTATIN CALCIUM 40 MG PO TABS: ORAL_TABLET | ORAL | 3 refills | Status: DC

## 2022-04-30 MED ORDER — ATORVASTATIN CALCIUM 20 MG PO TABS: 20.0000 mg | ORAL_TABLET | Freq: Every day | ORAL | 3 refills | Status: DC

## 2022-04-30 NOTE — Patient Instructions (Addendum)
Please get Prevnar 20 at local pharmacy- newest pneumonia vaccine  Please get flu vaccine at local pharmacy before Nov 1st  Recommend extra vitamin C 500 mg tablet for extra immunity if you do not want covid booster  Weight down 6 lbs- recommend eating more calories daily > 2200 kcal/day- will give you more energy

## 2022-04-30 NOTE — Progress Notes (Signed)
Careteam: Patient Care Team: Yvonna Alanis, NP as PCP - General (Adult Health Nurse Practitioner)  Seen by: Windell Moulding, AGNP-C  PLACE OF SERVICE:  [place] Directive information    No Known Allergies  Chief Complaint  Patient presents with   Medical Management of Chronic Issues    Patient presents today for a 6 month follow-up.   Quality Metric Gaps    Pneumonia, zoster, COVID#6     HPI: Patient is a 88 y.o. male seen today for medical management of chronic conditions.   No health concerns. No recent falls.   Sometimes feels tired, down 6 lbs. Eating 3 meals daily.   Member of [employer], exercises a few times weekly.   Lab work discussed today.   Reports having eye exam this year.   He has had dental cleaning within past month.   Discussed Prevnar 20 and flu vaccine- he will get at local pharmacy.    Review of Systems:  Review of Systems  Constitutional:  Positive for malaise/fatigue. Negative for chills, fever and weight loss.  HENT:  Negative for sore throat.   Eyes:  Negative for blurred vision and double vision.  Respiratory:  Negative for cough, shortness of breath and wheezing.   Cardiovascular:  Negative for chest pain and leg swelling.  Gastrointestinal:  Negative for blood in stool and heartburn.  Genitourinary:  Negative for dysuria, frequency and hematuria.  Musculoskeletal:  Negative for falls and joint pain.  Skin:  Negative for rash.  Neurological:  Negative for dizziness, weakness and headaches.  Psychiatric/Behavioral:  Negative for depression. The patient is not nervous/anxious.     Past Medical History:  Diagnosis Date   Hyperlipidemia    Past Surgical History:  Procedure Laterality Date   HIP SURGERY Right 07/26/2016   [facility]   Social History:   reports that he quit smoking about 63 years ago. His smoking use included cigarettes. He has a 25.00 pack-year smoking history. He has never used smokeless  tobacco. He reports current alcohol use. He reports that he does not use drugs.  Family History  Problem Relation Age of Onset   Heart attack Brother    Dementia Brother    Heart attack Brother    Heart disease Brother     Medications: Patient's Medications  New Prescriptions   No medications on file  Previous Medications   ATORVASTATIN (LIPITOR) 40 MG TABLET    Take 0.5 tablets (20 mg total) by mouth daily FOR High Cholesterol   MULTIPLE VITAMIN (ONE-A-DAY MENS PO)    Take by mouth daily.  Modified Medications   No medications on file  Discontinued Medications   No medications on file    Physical Exam:  Vitals:   04/30/22 1027  BP: 122/68  Pulse: 60  Resp: 20  Temp: (!) 97.1 F (36.2 C)  SpO2: 96%  Weight: 134 lb 12.8 oz (61.1 kg)  Height: '5\' 4"'$  (1.626 m)   Body mass index is 23.14 kg/m. Wt Readings from Last 3 Encounters:  04/30/22 134 lb 12.8 oz (61.1 kg)  10/16/21 138 lb 3.2 oz (62.7 kg)  09/18/21 140 lb 4.8 oz (63.6 kg)    Physical Exam Vitals reviewed.  Constitutional:      General: He is not in acute distress. HENT:     Head: Normocephalic.  Eyes:     General:        Right eye: No discharge.  Left eye: No discharge.  Neck:     Vascular: No carotid bruit.  Cardiovascular:     Rate and Rhythm: Normal rate and regular rhythm.     Pulses: Normal pulses.     Heart sounds: Normal heart sounds.  Pulmonary:     Effort: Pulmonary effort is normal. No respiratory distress.     Breath sounds: Normal breath sounds. No wheezing.  Abdominal:     General: Bowel sounds are normal. There is no distension.     Palpations: Abdomen is soft.     Tenderness: There is no abdominal tenderness.  Musculoskeletal:     Cervical back: Neck supple.     Right lower leg: No edema.     Left lower leg: No edema.  Lymphadenopathy:     Cervical: No cervical adenopathy.  Skin:    General: Skin is warm and dry.     Capillary Refill: Capillary refill takes less than 2  seconds.  Neurological:     General: No focal deficit present.     Mental Status: He is alert and oriented to person, place, and time.     Motor: No weakness.     Gait: Gait normal.  Psychiatric:        Mood and Affect: Mood normal.        Behavior: Behavior normal.     Labs reviewed: Basic Metabolic Panel: Recent Labs    10/16/21 1337  NA 142  K 4.2  CL 102  CO2 32  GLUCOSE 89  BUN 19  CREATININE 0.81  CALCIUM 9.6  TSH 2.37   Liver Function Tests: Recent Labs    10/16/21 1337  AST 22  ALT 23  BILITOT 0.5  PROT 6.9   No results for input(s): "LIPASE", "AMYLASE" in the last 8760 hours. No results for input(s): "AMMONIA" in the last 8760 hours. CBC: Recent Labs    10/16/21 1337  WBC 9.6  NEUTROABS 6,490  HGB 15.0  HCT 45.4  MCV 95.0  PLT 231   Lipid Panel: Recent Labs    04/20/22 0948  CHOL 159  HDL 59  LDLCALC 85  TRIG 68  CHOLHDL 2.7   TSH: Recent Labs    10/16/21 1337  TSH 2.37   A1C: Lab Results  Component Value Date   HGBA1C 6.2 (H) 10/16/2021     Assessment/Plan 1. Mixed hyperlipidemia - LDL 85, at goal < 100 - cont diet low in fat and fried foods - atorvastatin (LIPITOR) 40 MG tablet; Take 0.5 tablets (20 mg total) by mouth daily FOR High Cholesterol  Dispense: 45 tablet; Refill: 3  2. Prediabetes - A1c 6.2 10/2021 - diet controlled  3. Fatigue, unspecified type - down 6 lbs, eats healthy - TSH normal 10/2021 - recommend increasing calories for energy - may also be related to advanced age  36. Cognitive impairment - MMSE 23/30 10/16/2021 - forgetful at times - ambulates on own  Total time: 33 minutes. Greater than 50% of total time spent doing patient education regarding health maintenance, fatigue, elevated cholesterol, weight loss.    Next appt: Visit date not found  [place], [facility] [phone]

## 2022-05-24 DIAGNOSIS — Z23 Encounter for immunization: Secondary | ICD-10-CM | POA: Diagnosis not present

## 2022-06-11 DIAGNOSIS — D225 Melanocytic nevi of trunk: Secondary | ICD-10-CM | POA: Diagnosis not present

## 2022-06-11 DIAGNOSIS — L570 Actinic keratosis: Secondary | ICD-10-CM | POA: Diagnosis not present

## 2022-06-11 DIAGNOSIS — L821 Other seborrheic keratosis: Secondary | ICD-10-CM | POA: Diagnosis not present

## 2022-06-11 DIAGNOSIS — Z85828 Personal history of other malignant neoplasm of skin: Secondary | ICD-10-CM | POA: Diagnosis not present

## 2022-07-02 ENCOUNTER — Ambulatory Visit (INDEPENDENT_AMBULATORY_CARE_PROVIDER_SITE_OTHER): Payer: Medicare Other | Admitting: Orthopedic Surgery

## 2022-07-02 ENCOUNTER — Encounter: Payer: Self-pay | Admitting: Orthopedic Surgery

## 2022-07-02 VITALS — BP 122/78 | HR 54 | Temp 95.5°F | Ht 64.0 in | Wt 135.6 lb

## 2022-07-02 DIAGNOSIS — R350 Frequency of micturition: Secondary | ICD-10-CM

## 2022-07-02 MED ORDER — TAMSULOSIN HCL 0.4 MG PO CAPS: 0.4000 mg | ORAL_CAPSULE | Freq: Every day | ORAL | 1 refills | Status: DC

## 2022-07-02 NOTE — Patient Instructions (Addendum)
May try saw palmetto for prostate/urinary health  Limit caffeine/alcohol in diet  Try taking Flomax around 3pm for 1-2 weeks  May try vitamin C- 1000 mg daily for immunity boost

## 2022-07-02 NOTE — Progress Notes (Signed)
Careteam: Patient Care Team: Yvonna Alanis, NP as PCP - General (Adult Health Nurse Practitioner)  Seen by: Windell Moulding, AGNP-C  PLACE OF SERVICE:  [place] Directive information    No Known Allergies  Chief Complaint  Patient presents with   Acute Visit    Patient presents today for overactive bladder.     HPI: Patient is a 88 y.o. male seen today for acute visit due to urinary frequency.   He reports increased urinary frequency in early morning and afternoon. He reports some mild leakage if he cannot get to bathroom within reasonable time. Admits to drinking 2 cups of coffee daily. Averaging 2-3 episodes at night. He is about to go on vacation and concerned about frequency/urge to urinate. He was advised to try urinary supplement in past, but reports it did not help. Denies dysuria, hematuria, flank pain or fevers. Denies h/o prostate disorders.    Review of Systems:  Review of Systems  Constitutional:  Negative for chills and fever.  HENT:  Negative for congestion.   Eyes:  Negative for blurred vision.  Respiratory:  Negative for cough, shortness of breath and wheezing.   Cardiovascular:  Negative for chest pain and leg swelling.  Gastrointestinal:  Negative for blood in stool, constipation and diarrhea.  Genitourinary:  Positive for frequency and urgency. Negative for dysuria, flank pain and hematuria.  Musculoskeletal:  Negative for falls and joint pain.  Neurological:  Negative for dizziness and headaches.  Psychiatric/Behavioral:  Negative for depression. The patient is not nervous/anxious.     Past Medical History:  Diagnosis Date   Hyperlipidemia    Past Surgical History:  Procedure Laterality Date   HIP SURGERY Right 07/26/2016   [facility]   Social History:   reports that he quit smoking about 63 years ago. His smoking use included cigarettes. He has a 25.00 pack-year smoking history. He has never used smokeless tobacco. He reports  current alcohol use. He reports that he does not use drugs.  Family History  Problem Relation Age of Onset   Heart attack Brother    Dementia Brother    Heart attack Brother    Heart disease Brother     Medications: Patient's Medications  New Prescriptions   No medications on file  Previous Medications   ATORVASTATIN (LIPITOR) 20 MG TABLET    Take 1 tablet (20 mg total) by mouth daily.   MULTIPLE VITAMIN (ONE-A-DAY MENS PO)    Take by mouth daily.  Modified Medications   No medications on file  Discontinued Medications   No medications on file    Physical Exam:  Vitals:   07/02/22 1045  BP: 122/78  Pulse: (!) 54  Temp: (!) 95.5 F (35.3 C)  SpO2: 97%  Weight: 135 lb 9.6 oz (61.5 kg)  Height: '5\' 4"'$  (1.626 m)   Body mass index is 23.28 kg/m. Wt Readings from Last 3 Encounters:  07/02/22 135 lb 9.6 oz (61.5 kg)  04/30/22 134 lb 12.8 oz (61.1 kg)  10/16/21 138 lb 3.2 oz (62.7 kg)    Physical Exam Vitals reviewed.  Constitutional:      General: He is not in acute distress. HENT:     Head: Normocephalic.  Eyes:     General:        Right eye: No discharge.        Left eye: No discharge.  Cardiovascular:     Rate and Rhythm: Normal rate and regular rhythm.  Pulses: Normal pulses.     Heart sounds: Normal heart sounds.  Pulmonary:     Effort: Pulmonary effort is normal. No respiratory distress.     Breath sounds: Normal breath sounds. No wheezing.  Abdominal:     General: Bowel sounds are normal. There is no distension.     Palpations: Abdomen is soft.     Tenderness: There is no abdominal tenderness.  Musculoskeletal:     Cervical back: Neck supple.     Right lower leg: No edema.     Left lower leg: No edema.  Skin:    General: Skin is warm and dry.     Capillary Refill: Capillary refill takes less than 2 seconds.  Neurological:     General: No focal deficit present.     Mental Status: He is alert and oriented to person, place, and time.   Psychiatric:        Mood and Affect: Mood normal.        Behavior: Behavior normal.     Labs reviewed: Basic Metabolic Panel: Recent Labs    10/16/21 1337  NA 142  K 4.2  CL 102  CO2 32  GLUCOSE 89  BUN 19  CREATININE 0.81  CALCIUM 9.6  TSH 2.37   Liver Function Tests: Recent Labs    10/16/21 1337  AST 22  ALT 23  BILITOT 0.5  PROT 6.9   No results for input(s): "LIPASE", "AMYLASE" in the last 8760 hours. No results for input(s): "AMMONIA" in the last 8760 hours. CBC: Recent Labs    10/16/21 1337  WBC 9.6  NEUTROABS 6,490  HGB 15.0  HCT 45.4  MCV 95.0  PLT 231   Lipid Panel: Recent Labs    04/20/22 0948  CHOL 159  HDL 59  LDLCALC 85  TRIG 68  CHOLHDL 2.7   TSH: Recent Labs    10/16/21 1337  TSH 2.37   A1C: Lab Results  Component Value Date   HGBA1C 6.2 (H) 10/16/2021     Assessment/Plan 1. Urinary frequency - increased frequency in AM and afternoon, some incontinence - drinks coffee/alcohol daily - some nocturia- 2-3x/night - ? BPH versus caffeine intake - advised to cut back on caffeine - may also try saw palmetto - recommend trial flomax x 2 weeks to see if symptoms improved - tamsulosin (FLOMAX) 0.4 MG CAPS capsule; Take 1 capsule (0.4 mg total) by mouth daily.  Dispense: 30 capsule; Refill: 1  Total time: 21 minutes. Greater than 50% of total time spent doing patient education regarding urinary frequency including symptom/medication management.     Next appt: 10/29/2022  Windell Moulding, [facility] [phone]

## 2022-07-06 ENCOUNTER — Other Ambulatory Visit (HOSPITAL_BASED_OUTPATIENT_CLINIC_OR_DEPARTMENT_OTHER): Payer: Self-pay

## 2022-07-06 DIAGNOSIS — Z23 Encounter for immunization: Secondary | ICD-10-CM | POA: Diagnosis not present

## 2022-07-06 MED ORDER — COMIRNATY 30 MCG/0.3ML IM SUSY
PREFILLED_SYRINGE | INTRAMUSCULAR | 0 refills | Status: DC
  Filled 2022-07-06: qty 0.3, 1d supply, fill #0

## 2022-07-10 ENCOUNTER — Other Ambulatory Visit (HOSPITAL_BASED_OUTPATIENT_CLINIC_OR_DEPARTMENT_OTHER): Payer: Self-pay

## 2022-09-18 ENCOUNTER — Other Ambulatory Visit (HOSPITAL_BASED_OUTPATIENT_CLINIC_OR_DEPARTMENT_OTHER): Payer: Self-pay

## 2022-09-18 ENCOUNTER — Telehealth: Payer: Self-pay

## 2022-09-18 MED ORDER — AREXVY 120 MCG/0.5ML IM SUSR
INTRAMUSCULAR | 0 refills | Status: DC
  Filled 2022-09-18: qty 0.3, 1d supply, fill #0

## 2022-09-18 NOTE — Telephone Encounter (Signed)
Patient called to get Amy's expert advise on whether or not she believes he will benefit from getting the RSV vaccine   Please advise

## 2022-09-18 NOTE — Telephone Encounter (Signed)
I called patient and relayed Amy's response

## 2022-09-18 NOTE — Telephone Encounter (Signed)
Recommend due to advanced age.

## 2022-10-01 DIAGNOSIS — H2513 Age-related nuclear cataract, bilateral: Secondary | ICD-10-CM | POA: Diagnosis not present

## 2022-10-22 ENCOUNTER — Other Ambulatory Visit: Payer: Medicare Other

## 2022-10-22 DIAGNOSIS — R5383 Other fatigue: Secondary | ICD-10-CM

## 2022-10-22 DIAGNOSIS — E782 Mixed hyperlipidemia: Secondary | ICD-10-CM

## 2022-10-22 DIAGNOSIS — R7303 Prediabetes: Secondary | ICD-10-CM

## 2022-10-23 LAB — COMPLETE METABOLIC PANEL WITH GFR
AG Ratio: 1.8 (calc) (ref 1.0–2.5)
ALT: 27 U/L (ref 9–46)
AST: 20 U/L (ref 10–35)
Albumin: 4.7 g/dL (ref 3.6–5.1)
Alkaline phosphatase (APISO): 60 U/L (ref 35–144)
BUN: 20 mg/dL (ref 7–25)
CO2: 30 mmol/L (ref 20–32)
Calcium: 10.1 mg/dL (ref 8.6–10.3)
Chloride: 103 mmol/L (ref 98–110)
Creat: 0.9 mg/dL (ref 0.70–1.22)
Globulin: 2.6 g/dL (calc) (ref 1.9–3.7)
Glucose, Bld: 120 mg/dL — ABNORMAL HIGH (ref 65–99)
Potassium: 4.4 mmol/L (ref 3.5–5.3)
Sodium: 142 mmol/L (ref 135–146)
Total Bilirubin: 0.8 mg/dL (ref 0.2–1.2)
Total Protein: 7.3 g/dL (ref 6.1–8.1)
eGFR: 81 mL/min/{1.73_m2} (ref >=60)

## 2022-10-23 LAB — LIPID PANEL
Cholesterol: 172 mg/dL (ref <200)
HDL: 76 mg/dL (ref >=40)
LDL Cholesterol (Calc): 82 mg/dL (calc)
Non-HDL Cholesterol (Calc): 96 mg/dL (calc) (ref <130)
Total CHOL/HDL Ratio: 2.3 (calc) (ref <5.0)
Triglycerides: 67 mg/dL (ref <150)

## 2022-10-23 LAB — CBC WITH DIFFERENTIAL/PLATELET
Absolute Monocytes: 739 cells/uL (ref 200–950)
Basophils Absolute: 42 cells/uL (ref 0–200)
Basophils Relative: 0.5 %
Eosinophils Absolute: 143 cells/uL (ref 15–500)
Eosinophils Relative: 1.7 %
HCT: 45.5 % (ref 38.5–50.0)
Hemoglobin: 15.6 g/dL (ref 13.2–17.1)
Lymphs Abs: 2108 cells/uL (ref 850–3900)
MCH: 32.2 pg (ref 27.0–33.0)
MCHC: 34.3 g/dL (ref 32.0–36.0)
MCV: 93.8 fL (ref 80.0–100.0)
MPV: 9.5 fL (ref 7.5–12.5)
Monocytes Relative: 8.8 %
Neutro Abs: 5368 cells/uL (ref 1500–7800)
Neutrophils Relative %: 63.9 %
Platelets: 235 10*3/uL (ref 140–400)
RBC: 4.85 10*6/uL (ref 4.20–5.80)
RDW: 12.5 % (ref 11.0–15.0)
Total Lymphocyte: 25.1 %
WBC: 8.4 10*3/uL (ref 3.8–10.8)

## 2022-10-23 LAB — HEMOGLOBIN A1C
Hgb A1c MFr Bld: 6.4 % of total Hgb — ABNORMAL HIGH (ref <5.7)
Mean Plasma Glucose: 137 mg/dL
eAG (mmol/L): 7.6 mmol/L

## 2022-10-23 LAB — TSH: TSH: 3.84 mIU/L (ref 0.40–4.50)

## 2022-10-29 ENCOUNTER — Encounter: Payer: Self-pay | Admitting: Orthopedic Surgery

## 2022-10-29 ENCOUNTER — Ambulatory Visit (INDEPENDENT_AMBULATORY_CARE_PROVIDER_SITE_OTHER): Payer: Medicare Other | Admitting: Orthopedic Surgery

## 2022-10-29 VITALS — BP 110/70 | HR 56 | Temp 97.1°F | Resp 16 | Ht 64.0 in | Wt 134.6 lb

## 2022-10-29 DIAGNOSIS — E782 Mixed hyperlipidemia: Secondary | ICD-10-CM

## 2022-10-29 DIAGNOSIS — R350 Frequency of micturition: Secondary | ICD-10-CM

## 2022-10-29 DIAGNOSIS — R7303 Prediabetes: Secondary | ICD-10-CM

## 2022-10-29 NOTE — Patient Instructions (Addendum)
Try to reduce carbs and sugars in diet. Try to have one serving daily of carb  Look up diabetic diet and carbs  Consider carb ice cream or reduce serving size  Please get Prevnar 20 vaccine at local pharmacy  Please consider Shingrix vaccine this next year

## 2022-10-29 NOTE — Progress Notes (Signed)
Careteam: Patient Care Team: Yvonna Alanis, NP as PCP - General (Adult Health Nurse Practitioner)  Seen by: Windell Moulding, AGNP-C  PLACE OF SERVICE:  [place] Directive information Does Patient Have a Medical Advance Directive?: Yes, Type of Advance Directive: [place];Out of facility DNR (pink MOST or yellow form), Does patient want to make changes to medical advance directive?: No - Patient declined  No Known Allergies  Chief Complaint  Patient presents with   Medical Management of Chronic Issues    6 month follow up   Health Maintenance    Discuss the need for AWV scheduled 11/22/2022   Immunizations    Discuss the need for Shingrix vaccine, PNE vaccine, and Covid [place]. NCIR Verified.      HPI: Patient is a 89 y.o. male seen today for medical management of chronic conditions.   Labs reviewed with patient.   A1c 6.4> was 6.2. He is eating a carb with every meal and ice cream every night. Discussed limiting carb and sugars in diet. Plan to recheck levels in 3 months.   No health concerns.   No recent falls or injuries.   Urinary frequency improved with flomax.   Recommend Prevnar 20 and Shingrix vaccines.     Review of Systems:  Review of Systems  Constitutional:  Negative for fever and malaise/fatigue.  HENT:  Negative for congestion and sore throat.   Eyes:  Negative for blurred vision.  Respiratory:  Negative for cough, shortness of breath and wheezing.   Cardiovascular:  Negative for chest pain and leg swelling.  Gastrointestinal:  Negative for blood in stool and constipation.  Genitourinary:  Positive for frequency. Negative for dysuria.  Musculoskeletal:  Negative for falls and joint pain.  Skin:  Negative for rash.  Neurological:  Negative for dizziness, weakness and headaches.  Endo/Heme/Allergies:  Negative for polydipsia.  Psychiatric/Behavioral:  Negative for depression and memory loss. The patient is not  nervous/anxious and does not have insomnia.     Past Medical History:  Diagnosis Date   Hyperlipidemia    Past Surgical History:  Procedure Laterality Date   HIP SURGERY Right 07/26/2016   [facility]   Social History:   reports that he quit smoking about 64 years ago. His smoking use included cigarettes. He has a 25.00 pack-year smoking history. He has never used smokeless tobacco. He reports current alcohol use. He reports that he does not use drugs.  Family History  Problem Relation Age of Onset   Heart attack Brother    Dementia Brother    Heart attack Brother    Heart disease Brother     Medications: Patient's Medications  New Prescriptions   No medications on file  Previous Medications   ATORVASTATIN (LIPITOR) 20 MG TABLET    Take 1 tablet (20 mg total) by mouth daily.   MULTIPLE VITAMIN (ONE-A-DAY MENS PO)    Take by mouth daily.   TAMSULOSIN (FLOMAX) 0.4 MG CAPS CAPSULE    Take 1 capsule (0.4 mg total) by mouth daily.  Modified Medications   No medications on file  Discontinued Medications   COVID-19 MRNA VACCINE 2023-2024 (COMIRNATY) SYRINGE    Inject into the muscle.   RSV VACCINE RECOMB ADJUVANTED (AREXVY) 120 MCG/0.5ML INJECTION    Inject into the muscle.    Physical Exam:  Vitals:   10/29/22 0948  BP: 110/70  Pulse: (!) 56  Resp: 16  Temp: (!) 97.1 F (36.2 C)  SpO2:  98%  Weight: 134 lb 9.6 oz (61.1 kg)  Height: '5\' 4"'$  (1.626 m)   Body mass index is 23.1 kg/m. Wt Readings from Last 3 Encounters:  10/29/22 134 lb 9.6 oz (61.1 kg)  07/02/22 135 lb 9.6 oz (61.5 kg)  04/30/22 134 lb 12.8 oz (61.1 kg)    Physical Exam Vitals reviewed.  Constitutional:      General: He is not in acute distress. HENT:     Head: Normocephalic.  Eyes:     General:        Right eye: No discharge.        Left eye: No discharge.  Cardiovascular:     Rate and Rhythm: Normal rate and regular rhythm.     Pulses: Normal pulses.     Heart sounds: Normal  heart sounds.  Pulmonary:     Effort: Pulmonary effort is normal. No respiratory distress.     Breath sounds: Normal breath sounds. No wheezing.  Abdominal:     General: Bowel sounds are normal. There is no distension.     Palpations: Abdomen is soft.     Tenderness: There is no abdominal tenderness.  Musculoskeletal:     Cervical back: Neck supple.     Right lower leg: No edema.     Left lower leg: No edema.  Skin:    General: Skin is warm and dry.     Capillary Refill: Capillary refill takes less than 2 seconds.  Neurological:     General: No focal deficit present.     Mental Status: He is alert and oriented to person, place, and time.  Psychiatric:        Mood and Affect: Mood normal.        Behavior: Behavior normal.     Labs reviewed: Basic Metabolic Panel: Recent Labs    10/22/22 0805  NA 142  K 4.4  CL 103  CO2 30  GLUCOSE 120*  BUN 20  CREATININE 0.90  CALCIUM 10.1  TSH 3.84   Liver Function Tests: Recent Labs    10/22/22 0805  AST 20  ALT 27  BILITOT 0.8  PROT 7.3   No results for input(s): "LIPASE", "AMYLASE" in the last 8760 hours. No results for input(s): "AMMONIA" in the last 8760 hours. CBC: Recent Labs    10/22/22 0805  WBC 8.4  NEUTROABS 5,368  HGB 15.6  HCT 45.5  MCV 93.8  PLT 235   Lipid Panel: Recent Labs    04/20/22 0948 10/22/22 0805  CHOL 159 172  HDL 59 76  LDLCALC 85 82  TRIG 68 67  CHOLHDL 2.7 2.3   TSH: Recent Labs    10/22/22 0805  TSH 3.84   A1C: Lab Results  Component Value Date   HGBA1C 6.4 (H) 10/22/2022     Assessment/Plan 1. Prediabetes - A1c 6.4 (02/22)> was 6.2 (08/2021) - recommend dietary modifications first  - he was eating heavy carbs with most meals and ice cream every evening - advised to limit carbs and sugars in diet  2. Mixed hyperlipidemia - total 172, LDL 82 10/22/2022 - cont atorvastatin  3. Urinary frequency - improved with flomax  Total time: 31 minutes. Greater than 50%  of total time spent doing patient education regarding health maintenance, prediabetes, diabetic diet and elevated cholesterol including symptom/medication management.     Next appt: none Ashelyn Mccravy [place], [facility] [phone]

## 2022-10-30 ENCOUNTER — Other Ambulatory Visit (HOSPITAL_BASED_OUTPATIENT_CLINIC_OR_DEPARTMENT_OTHER): Payer: Self-pay

## 2022-10-30 DIAGNOSIS — Z23 Encounter for immunization: Secondary | ICD-10-CM | POA: Diagnosis not present

## 2022-10-30 MED ORDER — PREVNAR 20 0.5 ML IM SUSY
PREFILLED_SYRINGE | INTRAMUSCULAR | 0 refills | Status: DC
  Filled 2022-10-30: qty 0.5, 1d supply, fill #0

## 2022-10-31 ENCOUNTER — Other Ambulatory Visit: Payer: Self-pay | Admitting: Orthopedic Surgery

## 2022-10-31 DIAGNOSIS — R350 Frequency of micturition: Secondary | ICD-10-CM

## 2022-11-05 ENCOUNTER — Encounter: Payer: Medicare Other | Admitting: Orthopedic Surgery

## 2022-11-06 ENCOUNTER — Other Ambulatory Visit: Payer: Self-pay

## 2022-11-06 DIAGNOSIS — R7303 Prediabetes: Secondary | ICD-10-CM

## 2022-11-18 ENCOUNTER — Encounter: Payer: Self-pay | Admitting: Family Medicine

## 2022-11-18 ENCOUNTER — Ambulatory Visit (INDEPENDENT_AMBULATORY_CARE_PROVIDER_SITE_OTHER): Payer: Medicare Other | Admitting: Family Medicine

## 2022-11-18 VITALS — BP 118/78 | HR 60 | Temp 97.2°F | Resp 18 | Ht 64.0 in | Wt 134.1 lb

## 2022-11-18 DIAGNOSIS — R739 Hyperglycemia, unspecified: Secondary | ICD-10-CM

## 2022-11-18 DIAGNOSIS — E782 Mixed hyperlipidemia: Secondary | ICD-10-CM | POA: Diagnosis not present

## 2022-11-18 NOTE — Progress Notes (Signed)
Provider:  Alain Honey, MD  Careteam: Patient Care Team: Yvonna Alanis, NP as PCP - General (Adult Health Nurse Practitioner)  PLACE OF SERVICE:  [place] Directive information    No Known Allergies  Chief Complaint  Patient presents with   Medical Management of Chronic Issues    Medical Advice on Diabetes   Quality Metric Gaps    Needs to discuss Shingrix, Medicare Annual Wellness and Covid vaccine     HPI: Patient is a 89 y.o. male basically here to follow-up what they are calling type 2 diabetes.  Technically he is still in the prediabetes range.  His younger male partner who is also caretaker is concerned about what he should eat and not eat and we spent a good deal of time talking about the carbs.  I would be very surprised if he is really develops diabetes given his small stature (not obese).  Also has elevated lipids that are controlled with atorvastatin.  LDL is at goal. He denies any vision symptoms any numbness or tingling in his feet. Temps also send controls his nocturia.  Review of Systems:  Review of Systems  Respiratory: Negative.    Cardiovascular: Negative.   Genitourinary:  Positive for frequency.  Neurological: Negative.   Psychiatric/Behavioral: Negative.    All other systems reviewed and are negative.   Past Medical History:  Diagnosis Date   Hyperlipidemia    Past Surgical History:  Procedure Laterality Date   HIP SURGERY Right 07/26/2016   [facility]   Social History:   reports that he quit smoking about 64 years ago. His smoking use included cigarettes. He has a 25.00 pack-year smoking history. He has never used smokeless tobacco. He reports current alcohol use. He reports that he does not use drugs.  Family History  Problem Relation Age of Onset   Heart attack Brother    Dementia Brother    Heart attack Brother    Heart disease Brother     Medications: Patient's Medications  New Prescriptions   No  medications on file  Previous Medications   ATORVASTATIN (LIPITOR) 20 MG TABLET    Take 1 tablet (20 mg total) by mouth daily.   MULTIPLE VITAMIN (ONE-A-DAY MENS PO)    Take by mouth daily.   PNEUMOCOCCAL 20-VALENT CONJUGATE VACCINE (PREVNAR 20) 0.5 ML INJECTION    Inject into the muscle.   TAMSULOSIN (FLOMAX) 0.4 MG CAPS CAPSULE    Take 1 capsule (0.4 mg total) by mouth daily.  Modified Medications   No medications on file  Discontinued Medications   No medications on file    Physical Exam:  Vitals:   11/18/22 0922  BP: 118/78  Pulse: 60  Resp: 18  Temp: (!) 97.2 F (36.2 C)  SpO2: 97%  Weight: 134 lb 2 oz (60.8 kg)  Height: 5\' 4"  (1.626 m)   Body mass index is 23.02 kg/m. Wt Readings from Last 3 Encounters:  11/18/22 134 lb 2 oz (60.8 kg)  10/29/22 134 lb 9.6 oz (61.1 kg)  07/02/22 135 lb 9.6 oz (61.5 kg)    Physical Exam Vitals and nursing note reviewed.  Constitutional:      Appearance: Normal appearance.  Cardiovascular:     Rate and Rhythm: Normal rate and regular rhythm.     Pulses: Normal pulses.  Pulmonary:     Effort: Pulmonary effort is normal.     Breath sounds: Normal breath sounds.  Abdominal:     General:  Bowel sounds are normal.     Palpations: Abdomen is soft.  Skin:    Comments: Has a small skin tear on the dorsum of his right hand that is 80% healed  Neurological:     General: No focal deficit present.     Mental Status: He is alert and oriented to person, place, and time.  Psychiatric:        Mood and Affect: Mood normal.        Behavior: Behavior normal.     Labs reviewed: Basic Metabolic Panel: Recent Labs    10/22/22 0805  NA 142  K 4.4  CL 103  CO2 30  GLUCOSE 120*  BUN 20  CREATININE 0.90  CALCIUM 10.1  TSH 3.84   Liver Function Tests: Recent Labs    10/22/22 0805  AST 20  ALT 27  BILITOT 0.8  PROT 7.3   No results for input(s): "LIPASE", "AMYLASE" in the last 8760 hours. No results for input(s): "AMMONIA" in  the last 8760 hours. CBC: Recent Labs    10/22/22 0805  WBC 8.4  NEUTROABS 5,368  HGB 15.6  HCT 45.5  MCV 93.8  PLT 235   Lipid Panel: Recent Labs    04/20/22 0948 10/22/22 0805  CHOL 159 172  HDL 59 76  LDLCALC 85 82  TRIG 68 67  CHOLHDL 2.7 2.3   TSH: Recent Labs    10/22/22 0805  TSH 3.84   A1C: Lab Results  Component Value Date   HGBA1C 6.4 (H) 10/22/2022     Assessment/Plan  1. Hyperglycemia Patient still in prediabetes stage.  As noted above spent extra time talking about diet.  Does not need to lose any weight.  2. Mixed hyperlipidemia LDL is at goal on atorvastatin.  Will continue same.   Alain Honey, MD [facility] [phone]

## 2022-11-22 ENCOUNTER — Other Ambulatory Visit: Payer: Self-pay | Admitting: Orthopedic Surgery

## 2022-11-22 DIAGNOSIS — E782 Mixed hyperlipidemia: Secondary | ICD-10-CM

## 2022-11-23 ENCOUNTER — Other Ambulatory Visit: Payer: Self-pay | Admitting: Family Medicine

## 2022-11-23 ENCOUNTER — Other Ambulatory Visit: Payer: Self-pay

## 2022-11-23 DIAGNOSIS — E782 Mixed hyperlipidemia: Secondary | ICD-10-CM

## 2022-11-23 MED ORDER — ATORVASTATIN CALCIUM 20 MG PO TABS: 20.0000 mg | ORAL_TABLET | Freq: Every day | ORAL | 1 refills | Status: DC

## 2022-12-17 ENCOUNTER — Ambulatory Visit (INDEPENDENT_AMBULATORY_CARE_PROVIDER_SITE_OTHER): Payer: Medicare Other | Admitting: Orthopedic Surgery

## 2022-12-17 ENCOUNTER — Encounter: Payer: Self-pay | Admitting: Orthopedic Surgery

## 2022-12-17 VITALS — BP 120/70 | HR 56 | Temp 97.0°F | Resp 16 | Ht 64.0 in | Wt 133.2 lb

## 2022-12-17 DIAGNOSIS — Z0000 Encounter for general adult medical examination without abnormal findings: Secondary | ICD-10-CM | POA: Diagnosis not present

## 2022-12-17 NOTE — Patient Instructions (Signed)
  Mr. Mcclaren , Thank you for taking time to come for your Medicare Wellness Visit. I appreciate your ongoing commitment to your health goals. Please review the following plan we discussed and let me know if I can assist you in the future.   These are the goals we discussed:  Goals      Exercise 3x per week (30 min per time)     Starting 12/22/2016 I will start going to the gym using Silver Sneakers.        This is a list of the screening recommended for you and due dates:  Health Maintenance  Topic Date Due   COVID-19 Vaccine (6 - 2023-24 season) 01/02/2023*   Zoster (Shingles) Vaccine (1 of 2) 03/18/2023*   Flu Shot  04/01/2023   Medicare Annual Wellness Visit  12/17/2023   DTaP/Tdap/Td vaccine (3 - Td or Tdap) 09/15/2027   Pneumonia Vaccine  Completed   HPV Vaccine  Aged Out  *Topic was postponed. The date shown is not the original due date.

## 2022-12-17 NOTE — Progress Notes (Signed)
Subjective:   Dalton Golden is a 89 y.o. male who presents for Medicare Annual/Subsequent preventive examination.  Review of Systems     Cardiac Risk Factors include: advanced age (>32men, >21 women);dyslipidemia;male gender     Objective:    Today's Vitals   12/17/22 1313  BP: 120/70  Pulse: (!) 56  Resp: 16  Temp: (!) 97 F (36.1 C)  SpO2: 98%  Weight: 133 lb 3.2 oz (60.4 kg)  Height: 5\' 4"  (1.626 m)   Body mass index is 22.86 kg/m.     12/17/2022    1:15 PM 10/29/2022    9:52 AM 10/16/2021   10:52 AM 10/20/2019   11:19 AM 09/20/2018   10:45 AM 01/12/2018    9:49 AM 05/25/2017    8:45 AM  Advanced Directives  Does Patient Have a Medical Advance Directive? Yes Yes Yes Yes Yes Yes Yes  Type of Estate agent of [place];Out of facility DNR (pink MOST or yellow form) Healthcare Power of [place];Out of facility DNR (pink MOST or yellow form) Healthcare Power of Attorney Living will Healthcare Power of [employer] Power of [employer] Power of Attorney  Does patient want to make changes to medical advance directive? No - Patient declined No - Patient declined No - Patient declined No - Patient declined No - Patient declined No - Patient declined No - Patient declined  Copy of Healthcare Power of Attorney in Chart? Yes - validated most recent copy scanned in chart (See row information) Yes - validated most recent copy scanned in chart (See row information) Yes - validated most recent copy scanned in chart (See row information)  Yes - validated most recent copy scanned in chart (See row information) Yes     Current Medications (verified) Outpatient Encounter Medications as of 12/17/2022  Medication Sig   atorvastatin (LIPITOR) 20 MG tablet Take 1 tablet (20 mg total) by mouth daily.   Multiple Vitamin (ONE-A-DAY MENS PO) Take by mouth daily.   tamsulosin (FLOMAX) 0.4 MG CAPS capsule Take 1 capsule (0.4 mg total) by mouth daily.    [DISCONTINUED] pneumococcal 20-valent conjugate vaccine (PREVNAR 20) 0.5 ML injection Inject into the muscle.   No facility-administered encounter medications on file as of 12/17/2022.    Allergies (verified) Patient has no known allergies.   History: Past Medical History:  Diagnosis Date   Hyperlipidemia    Past Surgical History:  Procedure Laterality Date   HIP SURGERY Right 07/26/2016   [facility]   Family History  Problem Relation Age of Onset   Heart attack Brother    Dementia Brother    Heart attack Brother    Heart disease Brother    Social History   Socioeconomic History   Marital status: Widowed    Spouse name: Not on file   Number of children: Not on file   Years of education: Not on file   Highest education level: Not on file  Occupational History   Not on file  Tobacco Use   Smoking status: Former    Packs/day: 1.00    Years: 25.00    Additional pack years: 0.00    Total pack years: 25.00    Types: Cigarettes    Quit date: 08/31/1958    Years since quitting: 64.3   Smokeless tobacco: Never  Vaping Use   Vaping Use: Never used  Substance and Sexual Activity   Alcohol use: Yes    Comment: 2-3 drinks per week.   Drug  use: Never   Sexual activity: Not on file  Other Topics Concern   Not on file  Social History Narrative   Tobacco use, amount per day now: None   Past tobacco use, amount per day:   How many years did you use tobacco: From 1950-1955   Alcohol use (drinks per week): 2 drinks per week   Diet:   Do you drink/eat things with caffeine: Breakfast    Marital status:    Widow                               What year were you married? 1953   Do you live in a house, apartment, assisted living, condo, trailer, etc.?   Is it one or more stories?   How many persons live in your home?   Do you have pets in your home?( please list)   Highest Level of education completed? High School + 2 years of college   Current or past profession:    Do you exercise?       Yes                           Type and how often?   Do you have a living will? Yes   Do you have a DNR form?     Yes                              If not, do you want to discuss one?   Do you have signed POA/HPOA forms?  YEs                      If so, please bring to you appointment      Do you have any difficulty bathing or dressing yourself?    Do you have any difficulty preparing food or eating?   Do you have any difficulty managing your medications?   Do you have any difficulty managing your finances?   Do you have any difficulty affording your medications?    Social Determinants of Health   Financial Resource Strain: Low Risk  (12/17/2022)   Overall Financial Resource Strain (CARDIA)    Difficulty of Paying Living Expenses: Not hard at all  Food Insecurity: No Food Insecurity (12/17/2022)   Hunger Vital Sign    Worried About Running Out of Food in the Last Year: Never true    Ran Out of Food in the Last Year: Never true  Transportation Needs: No Transportation Needs (12/17/2022)   PRAPARE - Administrator, Civil Service (Medical): No    Lack of Transportation (Non-Medical): No  Physical Activity: Insufficiently Active (12/17/2022)   Exercise Vital Sign    Days of Exercise per Week: 3 days    Minutes of Exercise per Session: 40 min  Stress: No Stress Concern Present (12/17/2022)   [employer] of Occupational Health - Occupational Stress Questionnaire    Feeling of Stress : Not at all  Social Connections: Moderately Isolated (12/17/2022)   Social Connection and Isolation Panel [NHANES]    Frequency of Communication with Friends and Family: More than three times a week    Frequency of Social Gatherings with Friends and Family: More than three times a week    Attends Religious Services: More than 4 times per year  Active Member of Clubs or Organizations: No    Attends Banker Meetings: Never    Marital Status: Widowed     Tobacco Counseling Counseling given: Not Answered   Clinical Intake:  Pre-visit preparation completed: Yes  Pain : No/denies pain     BMI - recorded: 22.86 Nutritional Status: BMI of 19-24  Normal Nutritional Risks: None Diabetes: Yes Did pt. bring in CBG monitor from home?: No  How often do you need to have someone help you when you read instructions, pamphlets, or other written materials from your doctor or pharmacy?: 3 - Sometimes What is the last grade level you completed in school?: 2 years college  Diabetic:yes  Interpreter Needed?: No      Activities of Daily Living    12/17/2022    1:38 PM  In your present state of health, do you have any difficulty performing the following activities:  Hearing? 0  Vision? 0  Difficulty concentrating or making decisions? 0  Walking or climbing stairs? 0  Dressing or bathing? 0  Doing errands, shopping? 0  Preparing Food and eating ? N  Using the Toilet? N  In the past six months, have you accidently leaked urine? N  Do you have problems with loss of bowel control? N  Managing your Medications? N  Managing your Finances? N  Housekeeping or managing your Housekeeping? N    Patient Care Team: Octavia Heir, NP as PCP - General (Adult Health Nurse Practitioner)  Indicate any recent Medical Services you may have received from other than [facility] providers in the past year (date may be approximate).     Assessment:   This is a routine wellness examination for Dalton Golden.  Hearing/Vision screen Hearing Screening - Comments:: No hearing concerns.  Vision Screening - Comments:: No vision concerns. Patient last eye exam March 2024. Patient wears prescription glasses.   Dietary issues and exercise activities discussed: Current Exercise Habits: The patient does not participate in regular exercise at present, Exercise limited by: Other - see comments   Goals Addressed             This Visit's Progress    Exercise 3x per  week (30 min per time)   On track    Starting 12/22/2016 I will start going to the gym using Silver Sneakers.       Depression Screen    12/17/2022    1:14 PM 11/18/2022    9:22 AM 09/18/2021    1:44 PM 02/01/2018    9:53 AM 01/12/2018    9:52 AM 01/27/2017    8:57 AM 12/22/2016    8:44 AM  PHQ 2/9 Scores  PHQ - 2 Score 0 0 0 0 0 0 0    Fall Risk    12/17/2022    1:38 PM 12/17/2022    1:14 PM 11/18/2022    9:22 AM 10/29/2022    9:51 AM 07/02/2022   10:51 AM  Fall Risk   Falls in the past year? 0 0 0 0 0  Number falls in past yr: 0 0 0 0 0  Injury with Fall? 0 0 0 0 0  Risk for fall due to : History of fall(s);Impaired balance/gait;Impaired mobility No Fall Risks No Fall Risks No Fall Risks No Fall Risks  Follow up Falls evaluation completed;Education provided;Falls prevention discussed Falls evaluation completed Falls evaluation completed Falls evaluation completed     FALL RISK PREVENTION PERTAINING TO THE HOME:  Any stairs in or around the  home? Yes  If so, are there any without handrails? Yes  Home free of loose throw rugs in walkways, pet beds, electrical cords, etc? Yes  Adequate lighting in your home to reduce risk of falls? Yes   ASSISTIVE DEVICES UTILIZED TO PREVENT FALLS:  Life alert? No  Use of a cane, walker or w/c? No  Grab bars in the bathroom? Yes  Shower chair or bench in shower? Yes  Elevated toilet seat or a handicapped toilet? Yes   TIMED UP AND GO:  Was the test performed? No .  Length of time to ambulate 10 feet: N/A sec.   Gait steady and fast without use of assistive device  Cognitive Function:    12/17/2022    1:15 PM 10/16/2021    1:33 PM 01/12/2018   10:11 AM 12/22/2016    8:49 AM  MMSE - Mini Mental State Exam  Orientation to time Orientation to time comments year 2024, season summer, date 18th, day thursday, month August.     Orientation to Place Registration Registration-comments apple, table, penny.      Attention/ Calculation Attention/Calculation-comments DLO... patient kept repeating letters incorrectly.     Recall Recall-comments apple, penny.     Language- name 2 objects Language- repeat Language- follow 3 step command Language- read & follow direction Write a sentence Copy design 0 Total score Immunizations Immunization History  Administered Date(s) Administered   Fluad Quad(high Dose 65+) 06/14/2021, 05/23/2022   Influenza Split 06/03/2016, 05/03/2019, 05/26/2019, 06/28/2020   Influenza, High Dose Seasonal PF 06/27/2018   Influenza,inj,Quad PF,6+ Mos 05/25/2017   Influenza-Unspecified 06/03/2016   PFIZER(Purple Top)SARS-COV-2 Vaccination 09/25/2019, 10/16/2019   PNEUMOCOCCAL CONJUGATE-20 10/30/2022, 11/04/2022   Pfizer Covid-19 Vaccine Bivalent Booster 76yrs & up 05/26/2020, 12/06/2020, 05/19/2021   Respiratory Syncytial Virus Vaccine,Recomb Aduvanted(Arexvy) 09/18/2022   Td 09/14/2017   Tdap 09/14/2017    TDAP status: Up to date  Flu Vaccine status: Up to date  Pneumococcal vaccine status: Up to date  Covid-19 vaccine status: Completed vaccines  Qualifies for Shingles Vaccine? Yes   Zostavax completed No   Shingrix Completed?: No.    Education has been provided regarding the importance of this vaccine. Patient has been advised to call insurance company to determine out of pocket expense if they have not yet received this vaccine. Advised may also receive vaccine at local pharmacy or Health Dept. Verbalized acceptance and understanding.  Screening Tests Health Maintenance  Topic Date Due   Zoster Vaccines- Shingrix (1 of 2) Never done   COVID-19 Vaccine (6 - 2023-24 season) 05/01/2022   INFLUENZA VACCINE  04/01/2023   Medicare Annual Wellness (AWV)  12/17/2023   DTaP/Tdap/Td (3 - Td or Tdap) 09/15/2027   Pneumonia Vaccine 80+ Years old  Completed   HPV VACCINES  Aged  Out    Health Maintenance  Health Maintenance Due  Topic Date Due   Zoster Vaccines- Shingrix (1 of 2) Never done   COVID-19 Vaccine (6 - 2023-24 season) 05/01/2022    Colorectal cancer screening: No longer required.   Lung Cancer Screening: (Low Dose CT Chest recommended if Age 17-80 years, 30 pack-year  currently smoking OR have quit w/in 15years.) does not qualify.   Lung Cancer Screening Referral: No  Additional Screening:  Hepatitis C Screening: does not qualify; Completed   Vision Screening: Recommended annual ophthalmology exams for early detection of glaucoma and other disorders of the eye. Is the patient up to date with their annual eye exam?  Yes  Who is the provider or what is the name of the office in which the patient attends annual eye exams? [facility] If pt is not established with a provider, would they like to be referred to a provider to establish care? No .   Dental Screening: Recommended annual dental exams for proper oral hygiene  Community Resource Referral / Chronic Care Management: CRR required this visit?  No   CCM required this visit?  No      Plan:     I have personally reviewed and noted the following in the patient's chart:   Medical and social history Use of alcohol, tobacco or illicit drugs  Current medications and supplements including opioid prescriptions. Patient is not currently taking opioid prescriptions. Functional ability and status Nutritional status Physical activity Advanced directives List of other physicians Hospitalizations, surgeries, and ER visits in previous 12 months Vitals Screenings to include cognitive, depression, and falls Referrals and appointments  In addition, I have reviewed and discussed with patient certain preventive protocols, quality metrics, and best practice recommendations. A written personalized care plan for preventive services as well as general preventive health recommendations were provided to  patient.     Octavia Heir, NP   12/17/2022   Nurse Notes: will ask [facility]  for eye exam

## 2023-01-19 ENCOUNTER — Other Ambulatory Visit: Payer: Medicare Other

## 2023-01-19 DIAGNOSIS — R7303 Prediabetes: Secondary | ICD-10-CM | POA: Diagnosis not present

## 2023-01-20 LAB — HEMOGLOBIN A1C
Hgb A1c MFr Bld: 6.3 % of total Hgb — ABNORMAL HIGH (ref <5.7)
Mean Plasma Glucose: 134 mg/dL
eAG (mmol/L): 7.4 mmol/L

## 2023-01-21 ENCOUNTER — Ambulatory Visit (INDEPENDENT_AMBULATORY_CARE_PROVIDER_SITE_OTHER): Payer: Medicare Other | Admitting: Orthopedic Surgery

## 2023-01-21 ENCOUNTER — Encounter: Payer: Self-pay | Admitting: Orthopedic Surgery

## 2023-01-21 VITALS — BP 118/70 | HR 67 | Temp 97.7°F | Resp 16 | Ht 64.0 in | Wt 133.0 lb

## 2023-01-21 DIAGNOSIS — R29898 Other symptoms and signs involving the musculoskeletal system: Secondary | ICD-10-CM | POA: Diagnosis not present

## 2023-01-21 DIAGNOSIS — R7303 Prediabetes: Secondary | ICD-10-CM

## 2023-01-21 NOTE — Progress Notes (Signed)
Careteam: Patient Care Team: Octavia Heir, NP as PCP - General (Adult Health Nurse Practitioner) [facility]  Seen by: Hazle Nordmann, AGNP-C  PLACE OF SERVICE:  [facility]  Advanced Directive information Does Patient Have a Medical Advance Directive?: Yes, Type of Advance Directive: Healthcare Power of [place];Out of facility DNR (pink MOST or yellow form), Does patient want to make changes to medical advance directive?: No - Patient declined  No Known Allergies  Chief Complaint  Patient presents with   Medical Management of Chronic Issues    3 month follow up   Labs     Discuss recent lab results.   Concern     Patient spouse Sedalia Muta is concerns about patient walking. Wants to know if he could have Physical Therapy.      HPI: Patient is a 89 y.o. male seen today for medical management of chronic conditions.   A1c stable at 6.3 (05/21), was 6.4 (02/22). He has cut out [employer]. He is focusing on meat and vegetable diet more. No hypoglycemias. We discussed rechecking A1c in 6 months.   Right foot dragging more. He has not been going to [facility] due to upcoming more. S/p right hip replacement. Requesting PT. No recent fall or injury.   Review of Systems:  Review of Systems  Constitutional: Negative.   HENT: Negative.    Eyes: Negative.   Respiratory: Negative.    Cardiovascular: Negative.   Gastrointestinal: Negative.   Genitourinary: Negative.   Musculoskeletal:  Positive for joint pain. Negative for falls.  Skin: Negative.   Neurological: Negative.   Endo/Heme/Allergies:  Negative for polydipsia.  Psychiatric/Behavioral: Negative.      Past Medical History:  Diagnosis Date   Hyperlipidemia    Past Surgical History:  Procedure Laterality Date   HIP SURGERY Right 07/26/2016   [facility]   Social History:   reports that he quit smoking about 64 years ago. His smoking use included cigarettes. He has a 25.00 pack-year smoking history. He  has never used smokeless tobacco. He reports current alcohol use of about 7.0 standard drinks of alcohol per week. He reports that he does not use drugs.  Family History  Problem Relation Age of Onset   Heart attack Brother    Dementia Brother    Heart attack Brother    Heart disease Brother     Medications: Patient's Medications  New Prescriptions   No medications on file  Previous Medications   ATORVASTATIN (LIPITOR) 20 MG TABLET    Take 1 tablet (20 mg total) by mouth daily.   MULTIPLE VITAMIN (ONE-A-DAY MENS PO)    Take by mouth daily.   TAMSULOSIN (FLOMAX) 0.4 MG CAPS CAPSULE    Take 1 capsule (0.4 mg total) by mouth daily.   VITAMIN C (ASCORBIC ACID) 250 MG TABLET    Take 250 mg by mouth daily.  Modified Medications   No medications on file  Discontinued Medications   No medications on file    Physical Exam:  Vitals:   01/21/23 1244  BP: 118/70  Pulse: 67  Resp: 16  Temp: 97.7 F (36.5 C)  SpO2: 96%  Weight: 133 lb (60.3 kg)  Height: 5\' 4"  (1.626 m)   Body mass index is 22.83 kg/m. Wt Readings from Last 3 Encounters:  01/21/23 133 lb (60.3 kg)  12/17/22 133 lb 3.2 oz (60.4 kg)  11/18/22 134 lb 2 oz (60.8 kg)    Physical Exam Vitals reviewed.  Constitutional:  General: He is not in acute distress. HENT:     Head: Normocephalic.  Eyes:     General:        Right eye: No discharge.        Left eye: No discharge.  Neck:     Thyroid: No thyroid mass or thyromegaly.     Vascular: No carotid bruit.  Cardiovascular:     Rate and Rhythm: Normal rate and regular rhythm.     Pulses: Normal pulses.     Heart sounds: Normal heart sounds.  Pulmonary:     Effort: Pulmonary effort is normal.     Breath sounds: Normal breath sounds.  Abdominal:     General: Bowel sounds are normal. There is no distension.     Palpations: Abdomen is soft.     Tenderness: There is no abdominal tenderness.  Musculoskeletal:     Cervical back: Neck supple.     Right lower  leg: No edema.     Left lower leg: No edema.     Right foot: No swelling, deformity, tenderness or crepitus.  Lymphadenopathy:     Cervical: No cervical adenopathy.  Skin:    General: Skin is warm.  Neurological:     General: No focal deficit present.     Mental Status: He is alert and oriented to person, place, and time.     Motor: No weakness.     Gait: Gait normal.  Psychiatric:        Mood and Affect: Mood normal.     Labs reviewed: Basic Metabolic Panel: Recent Labs    10/22/22 0805  NA 142  K 4.4  CL 103  CO2 30  GLUCOSE 120*  BUN 20  CREATININE 0.90  CALCIUM 10.1  TSH 3.84   Liver Function Tests: Recent Labs    10/22/22 0805  AST 20  ALT 27  BILITOT 0.8  PROT 7.3   No results for input(s): "LIPASE", "AMYLASE" in the last 8760 hours. No results for input(s): "AMMONIA" in the last 8760 hours. CBC: Recent Labs    10/22/22 0805  WBC 8.4  NEUTROABS 5,368  HGB 15.6  HCT 45.5  MCV 93.8  PLT 235   Lipid Panel: Recent Labs    04/20/22 0948 10/22/22 0805  CHOL 159 172  HDL 59 76  LDLCALC 85 82  TRIG 68 67  CHOLHDL 2.7 2.3   TSH: Recent Labs    10/22/22 0805  TSH 3.84   A1C: Lab Results  Component Value Date   HGBA1C 6.3 (H) 01/19/2023     Assessment/Plan 1. Weakness of right foot - he reports dragging right foot at times - exam unremarkable - no recent injury - suspect from not exercising as much - Ambulatory referral to Physical Therapy  2. Prediabetes - A1c stable at 6.3> was 6.4 (02/22) - no hypoglycemias - diet controlled - cont limiting carbs and sugars in diet  Total time: 28 minutes. Greater than 50% of total time spent doing patient education regarding health maintenance, prediabetes, right foot weakness.    Next appt: Visit date not found  Jamian Andujo Scherry Ran  [facility] [phone]

## 2023-01-21 NOTE — Patient Instructions (Signed)
Limit carbs ans sugars in diet

## 2023-02-25 ENCOUNTER — Ambulatory Visit (HOSPITAL_BASED_OUTPATIENT_CLINIC_OR_DEPARTMENT_OTHER): Payer: Medicare Other | Attending: Orthopedic Surgery | Admitting: Physical Therapy

## 2023-02-25 ENCOUNTER — Encounter (HOSPITAL_BASED_OUTPATIENT_CLINIC_OR_DEPARTMENT_OTHER): Payer: Self-pay | Admitting: Physical Therapy

## 2023-02-25 ENCOUNTER — Other Ambulatory Visit: Payer: Self-pay

## 2023-02-25 DIAGNOSIS — M6281 Muscle weakness (generalized): Secondary | ICD-10-CM | POA: Insufficient documentation

## 2023-02-25 DIAGNOSIS — R29898 Other symptoms and signs involving the musculoskeletal system: Secondary | ICD-10-CM | POA: Insufficient documentation

## 2023-02-25 DIAGNOSIS — R262 Difficulty in walking, not elsewhere classified: Secondary | ICD-10-CM | POA: Insufficient documentation
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
## 2023-03-03 ENCOUNTER — Ambulatory Visit (HOSPITAL_BASED_OUTPATIENT_CLINIC_OR_DEPARTMENT_OTHER): Payer: Medicare Other | Attending: Orthopedic Surgery | Admitting: Physical Therapy

## 2023-03-03 ENCOUNTER — Encounter (HOSPITAL_BASED_OUTPATIENT_CLINIC_OR_DEPARTMENT_OTHER): Payer: Self-pay | Admitting: Physical Therapy

## 2023-03-03 DIAGNOSIS — M6281 Muscle weakness (generalized): Secondary | ICD-10-CM | POA: Insufficient documentation

## 2023-03-03 DIAGNOSIS — R262 Difficulty in walking, not elsewhere classified: Secondary | ICD-10-CM | POA: Insufficient documentation

## 2023-03-11 ENCOUNTER — Telehealth (HOSPITAL_BASED_OUTPATIENT_CLINIC_OR_DEPARTMENT_OTHER): Payer: Self-pay

## 2023-03-11 ENCOUNTER — Ambulatory Visit (HOSPITAL_BASED_OUTPATIENT_CLINIC_OR_DEPARTMENT_OTHER): Payer: Medicare Other

## 2023-03-11 NOTE — Telephone Encounter (Signed)
Spoke with pt regarding missed appointment. Pt forgot and did not have transportation. Reminded pt of next visit.

## 2023-03-17 ENCOUNTER — Encounter (HOSPITAL_BASED_OUTPATIENT_CLINIC_OR_DEPARTMENT_OTHER): Payer: Self-pay | Admitting: Physical Therapy

## 2023-03-17 ENCOUNTER — Ambulatory Visit (HOSPITAL_BASED_OUTPATIENT_CLINIC_OR_DEPARTMENT_OTHER): Payer: Medicare Other | Admitting: Physical Therapy

## 2023-03-17 DIAGNOSIS — M6281 Muscle weakness (generalized): Secondary | ICD-10-CM

## 2023-03-17 DIAGNOSIS — R262 Difficulty in walking, not elsewhere classified: Secondary | ICD-10-CM
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
## 2023-03-24 ENCOUNTER — Ambulatory Visit (HOSPITAL_BASED_OUTPATIENT_CLINIC_OR_DEPARTMENT_OTHER): Payer: Medicare Other

## 2023-03-24 ENCOUNTER — Encounter (HOSPITAL_BASED_OUTPATIENT_CLINIC_OR_DEPARTMENT_OTHER): Payer: Self-pay

## 2023-03-24 DIAGNOSIS — R262 Difficulty in walking, not elsewhere classified: Secondary | ICD-10-CM | POA: Diagnosis not present

## 2023-03-24 DIAGNOSIS — M6281 Muscle weakness (generalized): Secondary | ICD-10-CM | POA: Diagnosis not present
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
## 2023-03-31 ENCOUNTER — Encounter (HOSPITAL_BASED_OUTPATIENT_CLINIC_OR_DEPARTMENT_OTHER): Payer: Self-pay | Admitting: Physical Therapy

## 2023-03-31 ENCOUNTER — Ambulatory Visit (HOSPITAL_BASED_OUTPATIENT_CLINIC_OR_DEPARTMENT_OTHER): Payer: Medicare Other | Admitting: Physical Therapy

## 2023-03-31 DIAGNOSIS — R262 Difficulty in walking, not elsewhere classified: Secondary | ICD-10-CM | POA: Diagnosis not present

## 2023-03-31 DIAGNOSIS — M6281 Muscle weakness (generalized): Secondary | ICD-10-CM
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
## 2023-04-06 ENCOUNTER — Ambulatory Visit (HOSPITAL_BASED_OUTPATIENT_CLINIC_OR_DEPARTMENT_OTHER): Payer: Medicare Other | Attending: Orthopedic Surgery | Admitting: Physical Therapy

## 2023-04-06 ENCOUNTER — Encounter (HOSPITAL_BASED_OUTPATIENT_CLINIC_OR_DEPARTMENT_OTHER): Payer: Self-pay | Admitting: Physical Therapy

## 2023-04-06 DIAGNOSIS — R262 Difficulty in walking, not elsewhere classified: Secondary | ICD-10-CM | POA: Diagnosis not present

## 2023-04-06 DIAGNOSIS — M6281 Muscle weakness (generalized): Secondary | ICD-10-CM | POA: Insufficient documentation
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
## 2023-05-27 ENCOUNTER — Other Ambulatory Visit: Payer: Self-pay | Admitting: Orthopedic Surgery

## 2023-06-02 ENCOUNTER — Other Ambulatory Visit: Payer: Self-pay | Admitting: Orthopedic Surgery

## 2023-06-02 DIAGNOSIS — R350 Frequency of micturition: Secondary | ICD-10-CM

## 2023-06-09 DIAGNOSIS — Z23 Encounter for immunization: Secondary | ICD-10-CM | POA: Diagnosis not present

## 2023-06-14 DIAGNOSIS — D225 Melanocytic nevi of trunk: Secondary | ICD-10-CM | POA: Diagnosis not present

## 2023-06-14 DIAGNOSIS — Z85828 Personal history of other malignant neoplasm of skin: Secondary | ICD-10-CM | POA: Diagnosis not present

## 2023-06-14 DIAGNOSIS — C44311 Basal cell carcinoma of skin of nose: Secondary | ICD-10-CM | POA: Diagnosis not present

## 2023-06-14 DIAGNOSIS — L821 Other seborrheic keratosis: Secondary | ICD-10-CM | POA: Diagnosis not present

## 2023-06-14 DIAGNOSIS — L570 Actinic keratosis: Secondary | ICD-10-CM | POA: Diagnosis not present

## 2023-06-14 DIAGNOSIS — D485 Neoplasm of uncertain behavior of skin: Secondary | ICD-10-CM | POA: Diagnosis not present

## 2023-06-14 DIAGNOSIS — L814 Other melanin hyperpigmentation: Secondary | ICD-10-CM | POA: Diagnosis not present

## 2023-07-22 DIAGNOSIS — Z85828 Personal history of other malignant neoplasm of skin: Secondary | ICD-10-CM | POA: Diagnosis not present

## 2023-07-22 DIAGNOSIS — C44311 Basal cell carcinoma of skin of nose: Secondary | ICD-10-CM | POA: Diagnosis not present

## 2023-08-09 DIAGNOSIS — H6123 Impacted cerumen, bilateral: Secondary | ICD-10-CM | POA: Diagnosis not present

## 2023-08-31 DIAGNOSIS — S50311A Abrasion of right elbow, initial encounter: Secondary | ICD-10-CM | POA: Diagnosis not present

## 2023-08-31 DIAGNOSIS — Z6822 Body mass index (BMI) 22.0-22.9, adult: Secondary | ICD-10-CM | POA: Diagnosis not present

## 2023-09-24 DIAGNOSIS — R059 Cough, unspecified: Secondary | ICD-10-CM | POA: Diagnosis not present

## 2023-09-24 DIAGNOSIS — J111 Influenza due to unidentified influenza virus with other respiratory manifestations: Secondary | ICD-10-CM | POA: Diagnosis not present

## 2023-09-24 DIAGNOSIS — R509 Fever, unspecified: Secondary | ICD-10-CM | POA: Diagnosis not present

## 2023-09-24 DIAGNOSIS — Z6822 Body mass index (BMI) 22.0-22.9, adult: Secondary | ICD-10-CM | POA: Diagnosis not present

## 2023-11-04 DIAGNOSIS — Z6822 Body mass index (BMI) 22.0-22.9, adult: Secondary | ICD-10-CM | POA: Diagnosis not present

## 2023-11-04 DIAGNOSIS — K648 Other hemorrhoids: Secondary | ICD-10-CM | POA: Diagnosis not present

## 2023-12-23 ENCOUNTER — Encounter: Payer: Medicare Other | Admitting: Orthopedic Surgery

## 2023-12-27 ENCOUNTER — Other Ambulatory Visit: Payer: Self-pay | Admitting: Orthopedic Surgery

## 2023-12-27 DIAGNOSIS — R350 Frequency of micturition: Secondary | ICD-10-CM

## 2024-02-08 DIAGNOSIS — Z638 Other specified problems related to primary support group: Secondary | ICD-10-CM | POA: Diagnosis not present

## 2024-02-08 DIAGNOSIS — Z6823 Body mass index (BMI) 23.0-23.9, adult: Secondary | ICD-10-CM | POA: Diagnosis not present

## 2024-02-08 DIAGNOSIS — R7303 Prediabetes: Secondary | ICD-10-CM | POA: Diagnosis not present

## 2024-02-08 DIAGNOSIS — Z0000 Encounter for general adult medical examination without abnormal findings: Secondary | ICD-10-CM | POA: Diagnosis not present

## 2024-02-08 DIAGNOSIS — E7800 Pure hypercholesterolemia, unspecified: Secondary | ICD-10-CM | POA: Diagnosis not present

## 2024-02-10 ENCOUNTER — Other Ambulatory Visit: Payer: Self-pay | Admitting: Orthopedic Surgery

## 2024-02-15 ENCOUNTER — Other Ambulatory Visit (HOSPITAL_BASED_OUTPATIENT_CLINIC_OR_DEPARTMENT_OTHER): Payer: Self-pay

## 2024-02-16 ENCOUNTER — Other Ambulatory Visit: Payer: Self-pay | Admitting: Orthopedic Surgery

## 2024-02-29 DIAGNOSIS — R2689 Other abnormalities of gait and mobility: Secondary | ICD-10-CM | POA: Diagnosis not present

## 2024-02-29 DIAGNOSIS — R531 Weakness: Secondary | ICD-10-CM | POA: Diagnosis not present

## 2024-02-29 DIAGNOSIS — R2681 Unsteadiness on feet: Secondary | ICD-10-CM | POA: Diagnosis not present

## 2024-03-06 DIAGNOSIS — R2681 Unsteadiness on feet: Secondary | ICD-10-CM | POA: Diagnosis not present

## 2024-03-06 DIAGNOSIS — R531 Weakness: Secondary | ICD-10-CM | POA: Diagnosis not present

## 2024-03-06 DIAGNOSIS — R2689 Other abnormalities of gait and mobility: Secondary | ICD-10-CM | POA: Diagnosis not present

## 2024-03-08 DIAGNOSIS — R2681 Unsteadiness on feet: Secondary | ICD-10-CM | POA: Diagnosis not present

## 2024-03-08 DIAGNOSIS — R262 Difficulty in walking, not elsewhere classified: Secondary | ICD-10-CM | POA: Diagnosis not present

## 2024-03-08 DIAGNOSIS — R531 Weakness: Secondary | ICD-10-CM | POA: Diagnosis not present

## 2024-03-08 DIAGNOSIS — F4325 Adjustment disorder with mixed disturbance of emotions and conduct: Secondary | ICD-10-CM | POA: Diagnosis not present

## 2024-03-13 DIAGNOSIS — R531 Weakness: Secondary | ICD-10-CM | POA: Diagnosis not present

## 2024-03-13 DIAGNOSIS — R2689 Other abnormalities of gait and mobility: Secondary | ICD-10-CM | POA: Diagnosis not present

## 2024-03-13 DIAGNOSIS — R2681 Unsteadiness on feet: Secondary | ICD-10-CM | POA: Diagnosis not present

## 2024-03-15 DIAGNOSIS — R531 Weakness: Secondary | ICD-10-CM | POA: Diagnosis not present

## 2024-03-15 DIAGNOSIS — R2689 Other abnormalities of gait and mobility: Secondary | ICD-10-CM | POA: Diagnosis not present

## 2024-03-15 DIAGNOSIS — R2681 Unsteadiness on feet: Secondary | ICD-10-CM | POA: Diagnosis not present

## 2024-03-20 DIAGNOSIS — R2681 Unsteadiness on feet: Secondary | ICD-10-CM | POA: Diagnosis not present

## 2024-03-20 DIAGNOSIS — R531 Weakness: Secondary | ICD-10-CM | POA: Diagnosis not present

## 2024-03-20 DIAGNOSIS — R2689 Other abnormalities of gait and mobility: Secondary | ICD-10-CM | POA: Diagnosis not present

## 2024-03-22 DIAGNOSIS — R2681 Unsteadiness on feet: Secondary | ICD-10-CM | POA: Diagnosis not present

## 2024-03-22 DIAGNOSIS — R2689 Other abnormalities of gait and mobility: Secondary | ICD-10-CM | POA: Diagnosis not present

## 2024-03-22 DIAGNOSIS — R531 Weakness: Secondary | ICD-10-CM | POA: Diagnosis not present

## 2024-03-27 DIAGNOSIS — R531 Weakness: Secondary | ICD-10-CM | POA: Diagnosis not present

## 2024-03-27 DIAGNOSIS — R2681 Unsteadiness on feet: Secondary | ICD-10-CM | POA: Diagnosis not present

## 2024-03-27 DIAGNOSIS — R2689 Other abnormalities of gait and mobility: Secondary | ICD-10-CM | POA: Diagnosis not present

## 2024-03-29 DIAGNOSIS — R2681 Unsteadiness on feet: Secondary | ICD-10-CM | POA: Diagnosis not present

## 2024-03-29 DIAGNOSIS — R531 Weakness: Secondary | ICD-10-CM | POA: Diagnosis not present

## 2024-03-29 DIAGNOSIS — R2689 Other abnormalities of gait and mobility: Secondary | ICD-10-CM | POA: Diagnosis not present

## 2024-04-06 DIAGNOSIS — F4325 Adjustment disorder with mixed disturbance of emotions and conduct: Secondary | ICD-10-CM | POA: Diagnosis not present

## 2024-05-22 DIAGNOSIS — H0279 Other degenerative disorders of eyelid and periocular area: Secondary | ICD-10-CM | POA: Diagnosis not present

## 2024-05-22 DIAGNOSIS — D485 Neoplasm of uncertain behavior of skin: Secondary | ICD-10-CM | POA: Diagnosis not present

## 2024-05-22 DIAGNOSIS — Z01818 Encounter for other preprocedural examination: Secondary | ICD-10-CM | POA: Diagnosis not present

## 2024-05-25 DIAGNOSIS — F4325 Adjustment disorder with mixed disturbance of emotions and conduct: Secondary | ICD-10-CM | POA: Diagnosis not present

## 2024-06-28 DIAGNOSIS — H0279 Other degenerative disorders of eyelid and periocular area: Secondary | ICD-10-CM | POA: Diagnosis not present

## 2024-06-28 DIAGNOSIS — D485 Neoplasm of uncertain behavior of skin: Secondary | ICD-10-CM | POA: Diagnosis not present

## 2024-06-28 DIAGNOSIS — B078 Other viral warts: Secondary | ICD-10-CM | POA: Diagnosis not present

## 2024-07-03 DIAGNOSIS — Z6823 Body mass index (BMI) 23.0-23.9, adult: Secondary | ICD-10-CM | POA: Diagnosis not present

## 2024-07-03 DIAGNOSIS — R351 Nocturia: Secondary | ICD-10-CM | POA: Diagnosis not present

## 2024-07-03 DIAGNOSIS — N401 Enlarged prostate with lower urinary tract symptoms: Secondary | ICD-10-CM | POA: Diagnosis not present

## 2024-07-04 ENCOUNTER — Other Ambulatory Visit (HOSPITAL_BASED_OUTPATIENT_CLINIC_OR_DEPARTMENT_OTHER): Payer: Self-pay

## 2024-07-04 DIAGNOSIS — Z23 Encounter for immunization: Secondary | ICD-10-CM | POA: Diagnosis not present

## 2024-07-04 MED ORDER — FLUZONE HIGH-DOSE 0.5 ML IM SUSY
0.5000 mL | PREFILLED_SYRINGE | Freq: Once | INTRAMUSCULAR | 0 refills | Status: AC
Start: 2024-07-04 — End: 2024-07-05
  Filled 2024-07-04: qty 0.5, 1d supply, fill #0

## 2024-07-15 DIAGNOSIS — S59901A Unspecified injury of right elbow, initial encounter: Secondary | ICD-10-CM | POA: Diagnosis not present
